# Patient Record
Sex: Female | Born: 1980 | Race: White | Hispanic: No | Marital: Single | Smoking: Current every day smoker
Health system: Southern US, Community
[De-identification: ages and names within clinical notes are randomized; demographics above are authoritative.]

## PROBLEM LIST (undated history)

## (undated) DIAGNOSIS — F419 Anxiety disorder, unspecified: Secondary | ICD-10-CM

## (undated) DIAGNOSIS — E063 Autoimmune thyroiditis: Secondary | ICD-10-CM

## (undated) HISTORY — PX: TUBAL LIGATION: SHX77

## (undated) HISTORY — DX: Autoimmune thyroiditis: E06.3

## (undated) HISTORY — DX: Anxiety disorder, unspecified: F41.9

---

## 2003-07-29 ENCOUNTER — Other Ambulatory Visit: Payer: Self-pay

## 2006-07-12 ENCOUNTER — Emergency Department: Payer: Self-pay | Admitting: Unknown Physician Specialty

## 2010-04-21 ENCOUNTER — Emergency Department: Payer: Self-pay | Admitting: Emergency Medicine

## 2011-12-28 ENCOUNTER — Emergency Department: Payer: Self-pay | Admitting: Emergency Medicine

## 2012-02-22 ENCOUNTER — Emergency Department: Payer: Self-pay | Admitting: Emergency Medicine

## 2012-06-02 ENCOUNTER — Emergency Department: Payer: Self-pay | Admitting: Emergency Medicine

## 2013-01-18 HISTORY — PX: TUBAL LIGATION: SHX77

## 2013-05-05 ENCOUNTER — Emergency Department: Payer: Self-pay | Admitting: Emergency Medicine

## 2013-05-06 ENCOUNTER — Emergency Department: Payer: Self-pay | Admitting: Emergency Medicine

## 2013-05-15 ENCOUNTER — Ambulatory Visit: Payer: Self-pay | Admitting: Advanced Practice Midwife

## 2013-06-01 ENCOUNTER — Emergency Department: Payer: Self-pay | Admitting: Emergency Medicine

## 2013-10-01 ENCOUNTER — Emergency Department: Payer: Self-pay | Admitting: Emergency Medicine

## 2013-12-11 ENCOUNTER — Observation Stay: Payer: Self-pay | Admitting: Obstetrics & Gynecology

## 2014-01-03 ENCOUNTER — Observation Stay: Payer: Self-pay

## 2014-01-06 ENCOUNTER — Observation Stay: Payer: Self-pay

## 2014-01-06 LAB — PROTEIN / CREATININE RATIO, URINE
Creatinine, Urine: 59.8 mg/dL (ref 30.0–125.0)
PROTEIN/CREAT. RATIO: 334 mg/g{creat} — AB (ref 0–200)
Protein, Random Urine: 20 mg/dL — ABNORMAL HIGH (ref 0–12)

## 2014-01-07 ENCOUNTER — Inpatient Hospital Stay: Payer: Self-pay | Admitting: Obstetrics & Gynecology

## 2014-01-07 LAB — DRUG SCREEN, URINE
Amphetamines, Ur Screen: NEGATIVE (ref ?–1000)
Barbiturates, Ur Screen: NEGATIVE (ref ?–200)
Benzodiazepine, Ur Scrn: NEGATIVE (ref ?–200)
CANNABINOID 50 NG, UR ~~LOC~~: NEGATIVE (ref ?–50)
COCAINE METABOLITE, UR ~~LOC~~: NEGATIVE (ref ?–300)
MDMA (Ecstasy)Ur Screen: NEGATIVE (ref ?–500)
Methadone, Ur Screen: NEGATIVE (ref ?–300)
Opiate, Ur Screen: NEGATIVE (ref ?–300)
Phencyclidine (PCP) Ur S: NEGATIVE (ref ?–25)
TRICYCLIC, UR SCREEN: NEGATIVE (ref ?–1000)

## 2014-01-07 LAB — CBC WITH DIFFERENTIAL/PLATELET
BASOS PCT: 0.9 %
Basophil #: 0.1 10*3/uL (ref 0.0–0.1)
Eosinophil #: 0.2 10*3/uL (ref 0.0–0.7)
Eosinophil %: 1 %
HCT: 42.6 % (ref 35.0–47.0)
HGB: 14.2 g/dL (ref 12.0–16.0)
LYMPHS ABS: 2.4 10*3/uL (ref 1.0–3.6)
LYMPHS PCT: 14.8 %
MCH: 34.3 pg — AB (ref 26.0–34.0)
MCHC: 33.4 g/dL (ref 32.0–36.0)
MCV: 103 fL — AB (ref 80–100)
MONO ABS: 1 x10 3/mm — AB (ref 0.2–0.9)
MONOS PCT: 6.4 %
Neutrophil #: 12.2 10*3/uL — ABNORMAL HIGH (ref 1.4–6.5)
Neutrophil %: 76.9 %
Platelet: 164 10*3/uL (ref 150–440)
RBC: 4.14 10*6/uL (ref 3.80–5.20)
RDW: 14 % (ref 11.5–14.5)
WBC: 15.9 10*3/uL — ABNORMAL HIGH (ref 3.6–11.0)

## 2014-01-07 LAB — BASIC METABOLIC PANEL
Anion Gap: 10 (ref 7–16)
BUN: 11 mg/dL (ref 7–18)
CHLORIDE: 106 mmol/L (ref 98–107)
CREATININE: 0.64 mg/dL (ref 0.60–1.30)
Calcium, Total: 8.8 mg/dL (ref 8.5–10.1)
Co2: 21 mmol/L (ref 21–32)
EGFR (African American): >60
EGFR (Non-African Amer.): >60
GLUCOSE: 71 mg/dL (ref 65–99)
Osmolality: 272 (ref 275–301)
Potassium: 4 mmol/L (ref 3.5–5.1)
SODIUM: 137 mmol/L (ref 136–145)

## 2014-01-07 LAB — URIC ACID: URIC ACID: 4.7 mg/dL (ref 2.6–6.0)

## 2014-01-07 LAB — SGOT (AST)(ARMC): AST: 25 U/L (ref 15–37)

## 2014-01-08 LAB — HEMATOCRIT: HCT: 39 % (ref 35.0–47.0)

## 2014-05-11 NOTE — Op Note (Signed)
PATIENT NAME:  Debra GosselinOVERMAN, Bennie D MR#:  [id number] DATE OF BIRTH:  [birth year]  DATE OF PROCEDURE:  01/08/2014  PREOPERATIVE DIAGNOSIS: Desire for permanent sterility.   POSTOPERATIVE DIAGNOSIS: Desire for permanent sterility.   PROCEDURE PERFORMED: Postpartum tubal ligation.   SURGEON: Dierdre Searles. Paul Milanie Rosenfield, MD   ANESTHESIA: General.   ESTIMATED BLOOD LOSS: Minimal.   COMPLICATIONS: None.   FINDINGS: Normal tubes.   DISPOSITION: To recovery room stable.   TECHNIQUE: The patient is prepped and draped in the usual sterile fashion. After adequate anesthesia is obtained in the supine position on the Operating Room table, Marcaine 0.5% is used to anesthetize the skin just inferior to the umbilicus, followed by a skin incision using a scalpel. Dissection is carried down to the level of the rectus fascia, which is then tented anteriorly and incised with Metzenbaum scissors. Examination reveals no anterior abdominal wall adhesions. The patient is placed in Trendelenburg positioning and retractors are placed.   The right fallopian tube is identified and followed out to its fimbriae. Then the midportion is tied with 2 Vicryl sutures, excised, and cauterized. The left fallopian tube is identified out to its fimbriae, and then the midportion is grasped with a Babcock clamp and a loop is tied with 2 Vicryl sutures, excised, and cauterized. Excellent hemostasis is noted at the tubal sites.   The patient is leveled, and the peritoneum and fascia are closed with 0 Vicryl suture. Subcutaneous level is irrigated and skin is closed with Dermabond skin glue.   The patient goes to the recovery room in stable condition. All sponge, instrument, and needle counts were correct.    ____________________________ R. Annamarie MajorPaul Aletta Edmunds, MD rph:MT D: 01/08/2014 10:53:00 ET T: 01/08/2014 12:00:40 ET JOB#: [id number]  cc: Dierdre Searles. Paul Jarica Plass, MD, <Dictator> Nadara MustardOBERT P Jadelyn Elks MD ELECTRONICALLY SIGNED 01/08/2014 15:45

## 2014-05-13 LAB — SURGICAL PATHOLOGY

## 2014-05-28 NOTE — H&P (Signed)
L&D Evaluation:  History:  HPI 34 year old G3 P2002 with EDC=01/03/2014 by LMP=03/29/2013 and c/w a 6wk4day ultrasound presents at 40.4 weeks with c/o contractions. NO VB or LOF. Baby active. She was seen on L&D earlier in the evening and sent home due to no cervical change. Contractions irregular. PNC at [facility] remarkable for tobacco use, hx of using crack cocaine (last use 12/2012), and incarceration 12/2012-02/2013. Currently lives in a boarding house. 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 She is O POS/RI/VI/GBS negative. She desires a BTL pp.   Presents with contractions   Patient's Medical History Hx of substance abuse, trich, HSV   Patient's Surgical History none   Medications Pre Natal Vitamins   Allergies NKDA   Social History tobacco  drugs  1/2 PPD, crack/cocaine, MJ   Family History Non-Contributory   ROS:  ROS see HPI   Exam:  Vital Signs stable   Urine Protein not completed   General no apparent distress   Mental Status clear   Abdomen gravid, tender with contractions   Fetal Position vtx   Pelvic no external lesions, 3/100/-1   Mebranes Intact   FHT normal rate with no decels, 130s, moderate variability, +accels, no decls   FHT Description Cat 1   Ucx difficult to assess on the monitor   Skin dry, no lesions   Lymph no lymphadenopathy   Impression:  Impression early labor, reactive NST, IUP at 40.4 weeks. Reactive NST. labor   Plan:  Plan EFM/NST, admit for labor due to cervical change, IV analgesia, epidural when ready, UDS.   Follow Up Appointment need to schedule   Electronic Signatures: Jannet MantisSubudhi, Jaki Hammerschmidt (CNM)  (Signed 21-Dec-15 07:44)  Authored: L&D Evaluation   Last Updated: 21-Dec-15 07:44 by Jannet MantisSubudhi, Hillarie Harrigan (CNM)

## 2014-05-28 NOTE — H&P (Signed)
L&D Evaluation:  History:  HPI 34 year old G3 P2002 with EDC=01/03/2014 by LMP=03/29/2013 and c/w a 6wk4day ultrasound presents at 40 weeks with c/o sacral back pain. NO VB or LOF. Baby active. Back pain comes and goes. Contractions irregular. PNC at [facility] remarkable for tobacco use, hx of using crack cocaine (last use 12/2012, and incarceration 12/2012-02/2013. Currently lives in a boarding house. O POS/RI/VI.   Presents with back pain   Patient's Medical History Hx of substance abuse   Patient's Surgical History none   Medications Pre Natal Vitamins   Allergies NKDA   Social History tobacco  drugs  1/2 PPD   Family History Non-Contributory   ROS:  ROS see HPI   Exam:  Vital Signs stable  117/70   Urine Protein not completed   General no apparent distress   Mental Status clear   Abdomen gravid, non-tender   Fetal Position vtx   Pelvic no external lesions, FT/25-50%/-1 to -2   Mebranes Intact   FHT normal rate with no decels, 135 with accels to 170s   FHT Description Cat 1   Ucx irregular   Impression:  Impression IUP at 40 weeks. Reactive NST. Back pain with lower station . Not in labor   Plan:  Plan DC home with labor precautions.   Electronic Signatures: Trinna BalloonGutierrez, Sakura Denis L (CNM)  (Signed 18-Dec-15 08:50)  Authored: L&D Evaluation   Last Updated: 18-Dec-15 08:50 by Trinna BalloonGutierrez, Dellamae Rosamilia L (CNM)

## 2014-06-20 ENCOUNTER — Emergency Department
Admission: EM | Admit: 2014-06-20 | Discharge: 2014-06-20 | Disposition: A | Discharge status: Home / Self Care | Payer: Self-pay | Attending: Emergency Medicine | Admitting: Emergency Medicine

## 2014-06-20 ENCOUNTER — Encounter: Payer: Self-pay | Admitting: Emergency Medicine

## 2014-06-20 DIAGNOSIS — Z720 Tobacco use: Secondary | ICD-10-CM | POA: Insufficient documentation

## 2014-06-20 DIAGNOSIS — L259 Unspecified contact dermatitis, unspecified cause: Secondary | ICD-10-CM | POA: Insufficient documentation

## 2014-06-20 MED ORDER — HYDROXYZINE HCL 50 MG PO TABS
50.0000 mg | ORAL_TABLET | Freq: Once | ORAL | Status: AC
  Administered 2014-06-20: 50 mg via ORAL

## 2014-06-20 MED ORDER — HYDROXYZINE HCL 50 MG PO TABS
ORAL_TABLET | ORAL | Status: AC
  Administered 2014-06-20: 50 mg via ORAL
  Filled 2014-06-20: qty 1

## 2014-06-20 MED ORDER — HYDROXYZINE HCL 10 MG/5ML PO SYRP: 10.0000 mg | ORAL_SOLUTION | Freq: Once | ORAL | Status: DC

## 2014-06-20 MED ORDER — DEXAMETHASONE SODIUM PHOSPHATE 10 MG/ML IJ SOLN
10.0000 mg | Freq: Once | INTRAMUSCULAR | Status: AC
  Administered 2014-06-20: 10 mg via INTRAMUSCULAR

## 2014-06-20 MED ORDER — METHYLPREDNISOLONE 4 MG PO TBPK: ORAL_TABLET | ORAL | Status: DC

## 2014-06-20 MED ORDER — DEXAMETHASONE SODIUM PHOSPHATE 10 MG/ML IJ SOLN
INTRAMUSCULAR | Status: AC
  Administered 2014-06-20: 10 mg via INTRAMUSCULAR
  Filled 2014-06-20: qty 1

## 2014-06-20 MED ORDER — HYDROXYZINE HCL 50 MG PO TABS: 50.0000 mg | ORAL_TABLET | Freq: Three times a day (TID) | ORAL | Status: DC | PRN

## 2014-06-20 NOTE — ED Provider Notes (Signed)
[facility] Emergency Department Provider Note  ____________________________________________  Time seen: Approximately 6:53 PM  I have reviewed the triage vital signs and the nursing notes.   HISTORY  Chief Complaint Rash    HPI Debra Blake is a 34 y.o. female complaining of a rash on bilateral arms intermittently for 1 month. Patient state they itch and she is concerned possibly of a rash to her infant. 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 He stated this been no signs of secondary infection. States she gets some mild relief taking over-the-counter Benadryl. Denies any pain.  History reviewed. No pertinent past medical history.  There are no active problems to display for this patient.   Past Surgical History  Procedure Laterality Date  . Tubal ligation      No current outpatient prescriptions on file.  Allergies Review of patient's allergies indicates no known allergies.  No family history on file.  Social History History  Substance Use Topics  . Smoking status: Current Every Day Smoker -- 1.00 packs/day    Types: Cigarettes  . Smokeless tobacco: Not on file  . Alcohol Use: No    Review of Systems Constitutional: No fever/chills Eyes: No visual changes. ENT: No sore throat. Cardiovascular: Denies chest pain. Respiratory: Denies shortness of breath. Gastrointestinal: No abdominal pain.  No nausea, no vomiting.  No diarrhea.  No constipation. Genitourinary: Negative for dysuria. Musculoskeletal: Negative for back pain. Skin: Rash bilateral forearms. Neurological: Negative for headaches, focal weakness or numbness. 10-point ROS otherwise negative.  ____________________________________________   PHYSICAL EXAM:  VITAL SIGNS: ED Triage Vitals  Enc Vitals Group     BP 06/20/14 1821 120/81 mmHg     Pulse Rate 06/20/14 1821 91     Resp 06/20/14 1821 18     Temp 06/20/14 1821 98.1 F (36.7 C)     Temp Source  06/20/14 1821 Oral     SpO2 06/20/14 1821 99 %     Weight 06/20/14 1821 178 lb (80.74 kg)     Height 06/20/14 1821  (1.651 m)     Head Cir --      Peak Flow --      Pain Score --      Pain Loc --      Pain Edu? --      Excl. in GC? --     Constitutional: Alert and oriented. Well appearing and in no acute distress. Eyes: Conjunctivae are normal. PERRL. EOMI. Head: Atraumatic. Nose: No congestion/rhinnorhea. Mouth/Throat: Mucous membranes are moist.  Oropharynx non-erythematous. Neck: No stridor. No deformity neurovascularly intact pre-and equal range of motion. Hematological/Lymphatic/Immunilogical: No cervical lymphadenopathy. Cardiovascular: Normal rate, regular rhythm. Grossly normal heart sounds.  Good peripheral circulation. Respiratory: Normal respiratory effort.  No retractions. Lungs CTAB. Gastrointestinal: Soft and nontender. No distention. No abdominal bruits. No CVA tenderness. Musculoskeletal: No lower extremity tenderness nor edema.  No joint effusions. Neurologic:  Normal speech and language. No gross focal neurologic deficits are appreciated. Speech is normal. No gait instability. Skin:  Skin is warm, dry and intact. No rash noted. Psychiatric: Mood and affect are normal. Speech and behavior are normal.  ____________________________________________   LABS (all labs ordered are listed, but only abnormal results are displayed)  Labs Reviewed - No data to display ____________________________________________  EKG ____________________________________________  RADIOLOGY   ____________________________________________   PROCEDURES  Procedure(s) performed: None  Critical Care performed: No  ____________________________________________   INITIAL IMPRESSION / ASSESSMENT AND PLAN / ED COURSE  Pertinent labs & imaging results that were available during my care of the patient were reviewed by me and considered in my medical decision making (see chart for  details).  Contact dermatitis. ____________________________________________   FINAL CLINICAL IMPRESSION(S) / ED DIAGNOSES  Final diagnoses:  Contact dermatitis      Joni ReiningRonald K Dominigue Gellner, PA-C 06/20/14 1857  Richardean Canalavid H Yao, MD 06/20/14 2223

## 2014-06-20 NOTE — ED Notes (Signed)
Pt has red bumps on arms and back, states they itch.

## 2014-06-20 NOTE — Discharge Instructions (Signed)
Take medications as directed and follow up with Dermatologist if no improvement or worsen of compliant.

## 2014-08-13 ENCOUNTER — Encounter: Payer: Self-pay | Admitting: Emergency Medicine

## 2014-08-13 ENCOUNTER — Emergency Department
Admission: EM | Admit: 2014-08-13 | Discharge: 2014-08-13 | Disposition: A | Discharge status: Home / Self Care | Payer: Self-pay | Attending: Emergency Medicine | Admitting: Emergency Medicine

## 2014-08-13 DIAGNOSIS — T85848A Pain due to other internal prosthetic devices, implants and grafts, initial encounter: Secondary | ICD-10-CM

## 2014-08-13 DIAGNOSIS — K029 Dental caries, unspecified: Secondary | ICD-10-CM | POA: Insufficient documentation

## 2014-08-13 DIAGNOSIS — K0381 Cracked tooth: Secondary | ICD-10-CM | POA: Insufficient documentation

## 2014-08-13 DIAGNOSIS — Z7952 Long term (current) use of systemic steroids: Secondary | ICD-10-CM | POA: Insufficient documentation

## 2014-08-13 DIAGNOSIS — Y832 Surgical operation with anastomosis, bypass or graft as the cause of abnormal reaction of the patient, or of later complication, without mention of misadventure at the time of the procedure: Secondary | ICD-10-CM | POA: Insufficient documentation

## 2014-08-13 DIAGNOSIS — T827XXA Infection and inflammatory reaction due to other cardiac and vascular devices, implants and grafts, initial encounter: Secondary | ICD-10-CM | POA: Insufficient documentation

## 2014-08-13 DIAGNOSIS — Z720 Tobacco use: Secondary | ICD-10-CM | POA: Insufficient documentation

## 2014-08-13 MED ORDER — PENICILLIN V POTASSIUM 250 MG PO TABS: 500.0000 mg | ORAL_TABLET | Freq: Three times a day (TID) | ORAL | Status: DC

## 2014-08-13 MED ORDER — TRAMADOL HCL 50 MG PO TABS: 50.0000 mg | ORAL_TABLET | Freq: Four times a day (QID) | ORAL | Status: DC | PRN

## 2014-08-13 NOTE — ED Notes (Signed)
Patient to ED with c/o "several abscess" patient reports pain to both upper and lower jaw of right side.

## 2014-08-13 NOTE — ED Provider Notes (Signed)
[facility] Emergency Department Provider Note ____________________________________________  Time seen: Approximately 7:55 PM  I have reviewed the triage vital signs and the nursing notes.   HISTORY  Chief Complaint Dental Pain    HPI Debra Blake is a 34 y.o. female who presents to the emergency department for evaluation of dental pain. She states that she has had chronic dental pain for several months however over the past week the pain has increased on the right upper and lower jaw. She states that she has taken "over 100 ibuprofen" over the past week. She is unable to see a dentist due to the lack of dental insurance.   History reviewed. No pertinent past medical history.  There are no active problems to display for this patient.   Past Surgical History  Procedure Laterality Date  . Tubal ligation      Current Outpatient Rx  Name  Route  Sig  Dispense  Refill  . hydrOXYzine (ATARAX/VISTARIL) 50 MG tablet   Oral   Take 1 tablet (50 mg total) by mouth 3 (three) times daily as needed.   30 tablet   0   . methylPREDNISolone (MEDROL DOSEPAK) 4 MG TBPK tablet      Take Tapered dose as directed   21 tablet   0   . penicillin v potassium (VEETID) 250 MG tablet   Oral   Take 2 tablets (500 mg total) by mouth 3 (three) times daily.   60 tablet   0   . traMADol (ULTRAM) 50 MG tablet   Oral   Take 1 tablet (50 mg total) by mouth every 6 (six) hours as needed.   9 tablet   0     Allergies Review of patient's allergies indicates no known allergies.  History reviewed. No pertinent family history.  Social History History  Substance Use Topics  . Smoking status: Current Every Day Smoker -- 1.00 packs/day    Types: Cigarettes  . Smokeless tobacco: Not on file  . Alcohol Use: No    Review of Systems Constitutional: No fever/chills Eyes: No visual changes. ENT: No sore throat. Cardiovascular: Denies chest pain. Respiratory:  Denies shortness of breath. Gastrointestinal: No abdominal pain.  No nausea, no vomiting.  Genitourinary: Negative for dysuria. Musculoskeletal: Negative for back pain. Skin: Negative for rash. Neurological: Negative for headaches, focal weakness or numbness. 10-point ROS otherwise negative.  ____________________________________________   PHYSICAL EXAM:  VITAL SIGNS: ED Triage Vitals  Enc Vitals Group     BP 08/13/14 1742 111/80 mmHg     Pulse Rate 08/13/14 1742 88     Resp 08/13/14 1742 20     Temp 08/13/14 1742 98.3 F (36.8 C)     Temp Source 08/13/14 1742 Oral     SpO2 08/13/14 1742 97 %     Weight 08/13/14 1742 184 lb (83.462 kg)     Height 08/13/14 1742  (1.651 m)     Head Cir --      Peak Flow --      Pain Score 08/13/14 1743 10     Pain Loc --      Pain Edu? --      Excl. in GC? --     Constitutional: Alert and oriented. Well appearing and in no acute distress. Eyes: Conjunctivae are normal. PERRL. EOMI. Head: Atraumatic. Nose: No congestion/rhinnorhea. Mouth/Throat: Mucous membranes are moist.  Oropharynx non-erythematous. Periodontal Exam    widespread dental decay Neck: No stridor.  Hematological/Lymphatic/Immunilogical: No  cervical lymphadenopathy. Cardiovascular:   Good peripheral circulation. Respiratory: Normal respiratory effort.  No retractions. Musculoskeletal: No lower extremity tenderness nor edema.  No joint effusions. Neurologic:  Normal speech and language. No gross focal neurologic deficits are appreciated. Speech is normal. No gait instability. Skin:  Skin is warm, dry and intact. No rash noted. Psychiatric: Mood and affect are normal. Speech and behavior are normal.  ____________________________________________   LABS (all labs ordered are listed, but only abnormal results are displayed)  Labs Reviewed - No data to display ____________________________________________   RADIOLOGY  Not  indicated ____________________________________________   PROCEDURES  Procedure(s) performed: None  Critical Care performed: No  ____________________________________________   INITIAL IMPRESSION / ASSESSMENT AND PLAN / ED COURSE  Pertinent labs & imaging results that were available during my care of the patient were reviewed by me and considered in my medical decision making (see chart for details).  Patient was advised to see the dentist within 14 days. Also advised to take the antibiotic until finished. Instructed to return to the ER for symptoms that change or worsen if you are unable to schedule an appointment. ____________________________________________   FINAL CLINICAL IMPRESSION(S) / ED DIAGNOSES  Final diagnoses:  Dental implant pain, initial encounter       Chinita Pester, FNP 08/13/14 Paulo Fruit  Myrna Blazer, MD 08/17/14 2216

## 2014-08-13 NOTE — Discharge Instructions (Signed)
OPTIONS FOR DENTAL FOLLOW UP CARE ° °[place] Department of Health and Human Services - Local Safety Net Dental Clinics °http://www.ncdhhs.gov/dph/oralhealth/services/safetynetclinics.htm °  °[facility] (336-562-3123) ° °[facility] [place] (919-933-9087) ° °[facility] ([phone] ext [phone]) ° °[facility] ([phone]) ° °[facility] ([phone]) °This clinic caters to the indigent population and is on a lottery system. °Location: °[facility], [address], [place] °Clinic Hours: °Wednesdays from 6pm - 9pm, patients seen by a lottery system. °For dates, call or go to www.med.[facility].edu/[facility]/patients/Dental-[facility] °Services: °Cleanings, fillings and simple extractions. °Payment Options: °DENTAL WORK IS FREE OF CHARGE. Bring proof of income or support. °Best way to get seen: °Arrive at 5:15 pm - this is a lottery, NOT first come/first serve, so arriving earlier will not increase your chances of being seen. °  °  °[facility] °919-537-3737 °Select option 1 for emergencies °  °Location: °[facility], [address], [place] °Clinic Hours: °No walk-ins accepted - call the day before to schedule an appointment. °Check in times are 9:30 am and 1:30 pm. °Services: °Simple extractions, temporary fillings, pulpectomy/pulp debridement, uncomplicated abscess drainage. °Payment Options: °PAYMENT IS DUE AT THE TIME OF SERVICE.  Fee is usually $100-200, additional surgical procedures (e.g. abscess drainage) may be extra. °Cash, checks, Visa/MasterCard accepted.  Can file Medicaid if patient is covered for dental - patient should call case worker to check. °No discount for [facility] patients. °Best way to get seen: °MUST call the day before and get onto the schedule. Can usually be seen the next 1-2 days. No walk-ins accepted. °  °  °[facility] °[phone] °   °Location: °[facility], [address], [place] °Clinic Hours: °M, W, Th, F 8am or 1:30pm, Tues 9a or 1:30 - first come/first served. °Services: °Simple extractions, temporary fillings, uncomplicated abscess drainage.  You do not need to be an [place] resident. °Payment Options: °PAYMENT IS DUE AT THE TIME OF SERVICE. °Dental insurance, otherwise sliding scale - bring proof of income or support. °Depending on income and treatment needed, cost is usually $50-200. °Best way to get seen: °Arrive early as it is first come/first served. °  °  °[facility] °[phone] °  °Location: °[address] °Clinic Hours: °Mon-Thu 8a-5p °Services: °Most basic dental services including extractions and fillings. °Payment Options: °PAYMENT IS DUE AT THE TIME OF SERVICE. °Sliding scale, up to 50% off - bring proof if income or support. °Medicaid with dental option accepted. °Best way to get seen: °Call to schedule an appointment, can usually be seen within 2 weeks OR they will try to see walk-ins - show up at 8a or 2p (you may have to wait). °  °  °[facility] °919-245-2435 °[place] RESIDENTS ONLY °  °Location: °[facility], [address], [place], [place] [address] °Clinic Hours: By appointment only. °Monday - Thursday 8am-5pm, Friday 8am-12pm °Services: Cleanings, fillings, extractions. °Payment Options: °PAYMENT IS DUE AT THE TIME OF SERVICE. °Cash, Visa or MasterCard. Sliding scale - $30 minimum per service. °Best way to get seen: °Come in to office, complete packet and make an appointment - need proof of income °or support monies for each household member and proof of [place] residence. °Usually takes about a month to get in. °  °  °[facility] °[phone] °  °Location: °[address]   [place] °Clinic Hours: Walk-in Urgent Care Dental Services are offered Monday-Friday  mornings only. °The numbers of emergencies accepted daily is limited to the number of °providers available. °Maximum 15 - Mondays, Wednesdays & Thursdays °Maximum 10 - Tuesdays & Fridays °Services: °You do not need to be a  [place] resident to be seen for a dental emergency. °Emergencies are defined as pain, swelling, abnormal bleeding, or dental trauma. Walkins will receive x-rays if needed. °NOTE: Dental cleaning is not an emergency. °Payment Options: °PAYMENT IS DUE AT THE TIME OF SERVICE. °Minimum co-pay is $40.00 for uninsured patients. °Minimum co-pay is $3.00 for Medicaid with dental coverage. °Dental Insurance is accepted and must be presented at time of visit. °Medicare does not cover dental. °Forms of payment: Cash, credit card, checks. °Best way to get seen: °If not previously registered with the clinic, walk-in dental registration begins at 7:15 am and is on a first come/first serve basis. °If previously registered with the clinic, call to make an appointment. °  °  °[facility] °[phone] °[place] RESIDENTS ONLY °  °Location: °[address], [place], [place] °Clinic Hours: °Mon-Thu 10a-2p °Services: Extractions only! °Payment Options: °FREE (donations accepted) - bring proof of income or support °Best way to get seen: °Call and schedule an appointment OR come at 8am on the 1st Monday of every month (except for holidays) when it is first come/first served. °  °  °[facility] °[phone] °  °Location: °[address], [place] °Clinic Hours: °Friday mornings °Services, Payment Options, Best way to get seen: °Call for info °

## 2014-10-19 ENCOUNTER — Emergency Department
Admission: EM | Admit: 2014-10-19 | Discharge: 2014-10-20 | Disposition: A | Discharge status: Home / Self Care | Payer: Self-pay | Attending: Emergency Medicine | Admitting: Emergency Medicine

## 2014-10-19 DIAGNOSIS — Z792 Long term (current) use of antibiotics: Secondary | ICD-10-CM | POA: Insufficient documentation

## 2014-10-19 DIAGNOSIS — R531 Weakness: Secondary | ICD-10-CM | POA: Insufficient documentation

## 2014-10-19 DIAGNOSIS — Z720 Tobacco use: Secondary | ICD-10-CM | POA: Insufficient documentation

## 2014-10-19 DIAGNOSIS — R238 Other skin changes: Secondary | ICD-10-CM

## 2014-10-19 DIAGNOSIS — L988 Other specified disorders of the skin and subcutaneous tissue: Secondary | ICD-10-CM | POA: Insufficient documentation

## 2014-10-19 DIAGNOSIS — Z79899 Other long term (current) drug therapy: Secondary | ICD-10-CM | POA: Insufficient documentation

## 2014-10-19 DIAGNOSIS — R5382 Chronic fatigue, unspecified: Secondary | ICD-10-CM | POA: Insufficient documentation

## 2014-10-19 LAB — CBC
HEMATOCRIT: 40.3 % (ref 35.0–47.0)
HEMOGLOBIN: 13.7 g/dL (ref 12.0–16.0)
MCH: 32.6 pg (ref 26.0–34.0)
MCHC: 34.1 g/dL (ref 32.0–36.0)
MCV: 95.6 fL (ref 80.0–100.0)
Platelets: 183 10*3/uL (ref 150–440)
RBC: 4.22 MIL/uL (ref 3.80–5.20)
RDW: 13.6 % (ref 11.5–14.5)
WBC: 10.8 10*3/uL (ref 3.6–11.0)

## 2014-10-19 LAB — BASIC METABOLIC PANEL
ANION GAP: 6 (ref 5–15)
BUN: 15 mg/dL (ref 6–20)
CHLORIDE: 107 mmol/L (ref 101–111)
CO2: 25 mmol/L (ref 22–32)
Calcium: 8.8 mg/dL — ABNORMAL LOW (ref 8.9–10.3)
Creatinine, Ser: 0.89 mg/dL (ref 0.44–1.00)
GFR calc Af Amer: >60 mL/min (ref >60)
GFR calc non Af Amer: >60 mL/min (ref >60)
Glucose, Bld: 100 mg/dL — ABNORMAL HIGH (ref 65–99)
POTASSIUM: 4 mmol/L (ref 3.5–5.1)
SODIUM: 138 mmol/L (ref 135–145)

## 2014-10-19 LAB — URINALYSIS COMPLETE WITH MICROSCOPIC (ARMC ONLY)
BACTERIA UA: NONE SEEN
Bilirubin Urine: NEGATIVE
Glucose, UA: NEGATIVE mg/dL
Ketones, ur: NEGATIVE mg/dL
Nitrite: NEGATIVE
PROTEIN: NEGATIVE mg/dL
Specific Gravity, Urine: 1.017 (ref 1.005–1.030)
pH: 5 (ref 5.0–8.0)

## 2014-10-19 NOTE — Discharge Instructions (Signed)
Blisters Blisters are fluid-filled sacs that form within the skin. Common causes of blistering are friction, burns, and exposure to irritating chemicals. The fluid in the blister protects the underlying damaged skin. Most of the time it is not recommended that you open blisters. When a blister is opened, there is an increased chance for infection. Usually, a blister will open on its own. They then dry up and peel off within 10 days. If the blister is tense and uncomfortable (painful) the fluid may be drained. If it is drained the roof of the blister should be left intact. The draining should only be done by a medical professional under aseptic conditions. Poorly fitting shoes and boots can cause blisters by being too tight or too loose. Wearing extra socks or using tape, bandages, or pads over the blister-prone area helps prevent the problem by reducing friction. Blisters heal more slowly if you have diabetes or if you have problems with your circulation. You need to be careful about medical follow-up to prevent infection. HOME CARE INSTRUCTIONS  Protect areas where blisters have formed until the skin is healed. Use a special bandage with a hole cut in the middle around the blister. This reduces pressure and friction. When the blister breaks, trim off the loose skin and keep the area clean by washing it with soap daily. Soaking the blister or broken-open blister with diluted vinegar twice daily for 15 minutes will dry it up and speed the healing. Use 3 tablespoons of white vinegar per quart of water (45 mL white vinegar per liter of water). An antibiotic ointment and a bandage can be used to cover the area after soaking.  SEEK MEDICAL CARE IF:   You develop increased redness, pain, swelling, or drainage in the blistered area.  You develop a pus-like discharge from the blistered area, chills, or a fever. MAKE SURE YOU:   Understand these instructions.  Will watch your condition.  Will get help right  away if you are not doing well or get worse. Document Released: 02/12/2004 Document Revised: 03/29/2011 Document Reviewed: 01/10/2008 [facility] Patient Information 2015 [place], [place]. This information is not intended to replace advice given to you by your health care provider. Make sure you discuss any questions you have with your health care provider.  Chronic Fatigue Syndrome Chronic fatigue syndrome (CFS) is a condition in which there is lasting, extreme tiredness (fatigue) that does not improve with rest. CFS affects women up to four times more often than men. If you have CFS, fatigue and other symptoms can make it hard for you to get through your day. There is no treatment or cure. You will need to work closely with your health care provider to come up with a treatment plan that works for you. CAUSES  No one knows what causes CFS. It may be triggered by a flu-like illness or by mono. Other triggers may include:  An abnormal immune system.  Low blood pressure.  Poor diet.  Physical or emotional stress. SIGNS AND SYMPTOMS The main symptom is fatigue that lasts all day, especially after physical or mental stress. Other common symptoms include:  An extreme loss of energy with no obvious cause.  Muscle or joint soreness.  Severe weakness.  Frequent headaches.  Fever.  Sore throat.  Swollen lymph glands.  Sleep is not refreshing.  Loss of concentration or memory. Less common symptoms may include:  Chills.  Night sweats.  Tingling or numbness.  Blurred vision.  Dizziness.  Sensitivity to noise or  odors.  Mood swings.  Anxiety, panic attacks, and depression. Your symptoms may come and go, or you may have them all the time. DIAGNOSIS  There are no tests that can help health care providers diagnose CFS. It may take a long time for you to get a correct diagnosis. Your health care provider may need to do a number of tests to rule out other conditions that could be  causing your symptoms. You may be diagnosed with CFS if:  You have fatigue that has lasted for at least six months.  Your fatigue is not relieved by rest.  Your fatigue is not caused by another condition.  Your fatigue is severe enough to interfere with work and daily activities.  You have at least four common symptoms of CFS. TREATMENT  There is no cure for CFS at this time. The condition affects everyone differently. You will need to work with your health care provider to find the best treatment for your symptoms. Treatment may include:  Improving sleep with a regular bedtime routine.  Avoiding caffeine, alcohol, and tobacco.  Doing light exercise and stretching during the day.  Taking medicine to help you sleep.  Taking over-the-counter medicines to relieve joint or muscle pain.  Learning and practicing relaxation techniques.  Using memory aids or doing brain teasers to improve memory and concentration.  Seeing a mental health professional to evaluate and treat depression, if necessary.  Trying massage therapy, acupuncture, and movement exercises, like yoga or tai chi. [place] Work closely with your health care provider to follow your treatment plan at home. You may need to make major lifestyle changes. If treatment does not seem to help, get a second opinion. You may get help from many health care providers, including doctors, mental health specialists, physical therapists, and rehabilitation therapists. Having the support of friends and loved ones is also important. SEEK MEDICAL CARE IF:  Your symptoms are not responding to treatment.  You are having strong feelings of anger, guilt, anxiety, or depression. Document Released: 02/12/2004 Document Revised: 05/21/2013 Document Reviewed: 11/24/2012 [facility] Patient Information 2015 [place], [place]. This information is not intended to replace advice given to you by your health care provider. Make sure you  discuss any questions you have with your health care provider.

## 2014-10-19 NOTE — ED Notes (Signed)
"  I've been noticing that I've been getting these blisters for several weeks. And I feel real weak."

## 2014-10-19 NOTE — ED Provider Notes (Signed)
[facility] Emergency Department Provider Note  ____________________________________________  Time seen: Approximately 11:41 PM  I have reviewed the triage vital signs and the nursing notes.   HISTORY  Chief Complaint Blister and Weakness    HPI Debra Blake is a 34 y.o. female who comes into the hospital today with a blister on her left ankle. The patient reports that she's had similar ones come up on her shin and on her arms in the past. She reports that she breaks out occasionally. She noticed these blisters last night and became concerned so she decided to come in for evaluation. The patient reports that they typically start off as a spot on her skin and then turned into blisters. She reports that the spots have been coming up for the past year but have been blistering in the past month. She reports that usually date itchy at first and then they become sore after she's itched them. The patient denies spending any time outdoors and denies any contact with possible poison ivy or poison oak. The patient reports that in the past she has put hydrocortisone on the blisters but did not put anything on these blisters on her ankle. The patient does not have a primary care physician so she has not been to the doctor. She also reports that she feels tired all the time and weak. She reports that she has been feeling this way for the past few months. The patient reports that she's been eating well and sleeping well has not had this fatigue evaluated. He denies any other pain or any abdominal pain no nausea vomiting headache or blurry vision.   No past medical history on file.  There are no active problems to display for this patient.   Past Surgical History  Procedure Laterality Date  . Tubal ligation      Current Outpatient Rx  Name  Route  Sig  Dispense  Refill  . medroxyPROGESTERone (DEPO-PROVERA) 150 MG/ML injection   Intramuscular   Inject 1 mL into the  muscle every 3 (three) months.      3   . hydrOXYzine (ATARAX/VISTARIL) 50 MG tablet   Oral   Take 1 tablet (50 mg total) by mouth 3 (three) times daily as needed.   30 tablet   0   . methylPREDNISolone (MEDROL DOSEPAK) 4 MG TBPK tablet      Take Tapered dose as directed   21 tablet   0   . penicillin v potassium (VEETID) 250 MG tablet   Oral   Take 2 tablets (500 mg total) by mouth 3 (three) times daily.   60 tablet   0   . traMADol (ULTRAM) 50 MG tablet   Oral   Take 1 tablet (50 mg total) by mouth every 6 (six) hours as needed.   9 tablet   0     Allergies Review of patient's allergies indicates no known allergies.  No family history on file.  Social History Social History  Substance Use Topics  . Smoking status: Current Every Day Smoker -- 1.00 packs/day    Types: Cigarettes  . Smokeless tobacco: Not on file  . Alcohol Use: No    Review of Systems Constitutional: Fatigue Eyes: No visual changes. ENT: No sore throat. Cardiovascular: Denies chest pain. Respiratory: Denies shortness of breath. Gastrointestinal: No abdominal pain.  No nausea, no vomiting.  No diarrhea.  No constipation. Genitourinary: Negative for dysuria. Musculoskeletal: Negative for back pain. Skin: Blisters and rash Neurological:  Negative for headaches, focal weakness or numbness.  10-point ROS otherwise negative.  ____________________________________________   PHYSICAL EXAM:  VITAL SIGNS: ED Triage Vitals  Enc Vitals Group     BP 10/19/14 1927 130/77 mmHg     Pulse Rate 10/19/14 1927 74     Resp 10/19/14 1927 20     Temp 10/19/14 1927 98.2 F (36.8 C)     Temp Source 10/19/14 1927 Oral     SpO2 10/19/14 1927 99 %     Weight 10/19/14 1927 180 lb (81.647 kg)     Height 10/19/14 1927  (1.651 m)     Head Cir --      Peak Flow --      Pain Score 10/19/14 1928 10     Pain Loc --      Pain Edu? --      Excl. in GC? --     Constitutional: Alert and oriented. Well  appearing and in no acute distress. Eyes: Conjunctivae are normal. PERRL. EOMI. Head: Atraumatic. Nose: No congestion/rhinnorhea. Mouth/Throat: Mucous membranes are moist.  Oropharynx non-erythematous. Cardiovascular: Normal rate, regular rhythm. Grossly normal heart sounds.  Good peripheral circulation. Respiratory: Normal respiratory effort.  No retractions. Lungs CTAB. Gastrointestinal: Soft and nontender. No distention. Positive bowel sounds Musculoskeletal: No lower extremity tenderness nor edema.  Neurologic:  Normal speech and language. No gross focal neurologic deficits are appreciated. Skin:  Skin is warm, dry and intact. Blisters noted to posterior left ankle one approximately 1 cm in diameter and the other is linear clear appearing fluid with no significant tenderness to palpation. Macular lesions to left leg began linear with no significant papules. Healing blisters to the patient's hand and wrist. Psychiatric: Mood and affect are normal.   ____________________________________________   LABS (all labs ordered are listed, but only abnormal results are displayed)  Labs Reviewed  BASIC METABOLIC PANEL - Abnormal; Notable for the following:    Glucose, Bld 100 (*)    Calcium 8.8 (*)    All other components within normal limits  URINALYSIS COMPLETEWITH MICROSCOPIC ([facility] ONLY) - Abnormal; Notable for the following:    Color, Urine YELLOW (*)    APPearance CLEAR (*)    Hgb urine dipstick 3+ (*)    Leukocytes, UA TRACE (*)    Squamous Epithelial / LPF 0-5 (*)    All other components within normal limits  CBC  TSH  POC URINE PREG, ED   ____________________________________________  EKG  ED ECG REPORT I, Rebecka Apley, the attending physician, personally viewed and interpreted this ECG.   Date: 10/19/2014  EKG Time: 1943  Rate: 60  Rhythm: normal EKG, normal sinus rhythm  Axis: Normal  Intervals:none  ST&T Change:  None  ____________________________________________  RADIOLOGY  None ____________________________________________   PROCEDURES  Procedure(s) performed: None  Critical Care performed: No  ____________________________________________   INITIAL IMPRESSION / ASSESSMENT AND PLAN / ED COURSE  Pertinent labs & imaging results that were available during my care of the patient were reviewed by me and considered in my medical decision making (see chart for details).  The patient is a 34 year old female who comes in today with blisters to her left ankle. The patient reports she is unsure what it is due to and does not have any known exposures. The blisters are small but tense with clear appearing fluid. I informed the patient that she probably needs to have these lesions biopsied. The patient also has a relative 3 lesions on her  left leg as well which may have the appearance of bedbug bites. The patient reports that they are itchy as well. I informed her that again she does need to have this biopsied for evaluation and to look around her house for possible infestation. I informed the patient that I would check her thyroid for the fatigue as the remainder of her blood work was unremarkable but she reports that she needed to be discharged home to pick up her children. I did give the patient a referral to the acute care clinic as well as [facility]. The patient will be discharged to follow-up. I informed the patient that she could use hydrocortisone on her blisters as well. ____________________________________________   FINAL CLINICAL IMPRESSION(S) / ED DIAGNOSES  Final diagnoses:  Blisters of multiple sites  Chronic fatigue      Rebecka Apley, MD 10/20/14 [phone]

## 2014-10-20 LAB — TSH: TSH: 21.711 u[IU]/mL — AB (ref 0.350–4.500)

## 2014-10-20 NOTE — ED Notes (Signed)

## 2015-02-25 ENCOUNTER — Encounter: Payer: Self-pay | Admitting: Emergency Medicine

## 2015-02-25 DIAGNOSIS — K047 Periapical abscess without sinus: Secondary | ICD-10-CM | POA: Insufficient documentation

## 2015-02-25 DIAGNOSIS — F17210 Nicotine dependence, cigarettes, uncomplicated: Secondary | ICD-10-CM | POA: Insufficient documentation

## 2015-02-25 DIAGNOSIS — Z792 Long term (current) use of antibiotics: Secondary | ICD-10-CM | POA: Insufficient documentation

## 2015-02-25 DIAGNOSIS — K029 Dental caries, unspecified: Secondary | ICD-10-CM | POA: Insufficient documentation

## 2015-02-25 MED ORDER — LIDOCAINE VISCOUS 2 % MT SOLN
15.0000 mL | Freq: Once | OROMUCOSAL | Status: AC
  Administered 2015-02-25: 15 mL via OROMUCOSAL
  Filled 2015-02-25: qty 15

## 2015-02-25 NOTE — ED Notes (Signed)
Pt presents to ED with pain to the bottom right side of her mouth. Pt states she has a hx of tooth abscess reports this feels the same.

## 2015-02-26 ENCOUNTER — Emergency Department
Admission: EM | Admit: 2015-02-26 | Discharge: 2015-02-26 | Disposition: A | Discharge status: Home / Self Care | Payer: Self-pay | Attending: Emergency Medicine | Admitting: Emergency Medicine

## 2015-02-26 DIAGNOSIS — K047 Periapical abscess without sinus: Secondary | ICD-10-CM

## 2015-02-26 MED ORDER — LIDOCAINE VISCOUS 2 % MT SOLN
15.0000 mL | Freq: Once | OROMUCOSAL | Status: AC
  Administered 2015-02-26: 15 mL via OROMUCOSAL
  Filled 2015-02-26: qty 15

## 2015-02-26 MED ORDER — IBUPROFEN 800 MG PO TABS
800.0000 mg | ORAL_TABLET | Freq: Once | ORAL | Status: AC
  Administered 2015-02-26: 800 mg via ORAL

## 2015-02-26 MED ORDER — AMOXICILLIN 500 MG PO CAPS: 500.0000 mg | ORAL_CAPSULE | Freq: Two times a day (BID) | ORAL | Status: DC

## 2015-02-26 MED ORDER — AMOXICILLIN 500 MG PO CAPS
500.0000 mg | ORAL_CAPSULE | Freq: Once | ORAL | Status: AC
  Administered 2015-02-26: 500 mg via ORAL
  Filled 2015-02-26: qty 1

## 2015-02-26 MED ORDER — TRAMADOL HCL 50 MG PO TABS: 50.0000 mg | ORAL_TABLET | Freq: Four times a day (QID) | ORAL | Status: DC | PRN

## 2015-02-26 MED ORDER — OXYCODONE-ACETAMINOPHEN 5-325 MG PO TABS
1.0000 | ORAL_TABLET | Freq: Once | ORAL | Status: AC
  Administered 2015-02-26: 1 via ORAL
  Filled 2015-02-26: qty 1

## 2015-02-26 MED ORDER — IBUPROFEN 800 MG PO TABS
ORAL_TABLET | ORAL | Status: AC
  Administered 2015-02-26: 800 mg via ORAL
  Filled 2015-02-26: qty 1

## 2015-02-26 NOTE — ED Notes (Signed)
Pt asking for pain medication while in the lobby. Orders received.

## 2015-02-26 NOTE — Discharge Instructions (Signed)

## 2015-02-26 NOTE — ED Provider Notes (Signed)
[facility] Emergency Department Provider Note  ____________________________________________  Time seen: 3:20 AM  I have reviewed the triage vital signs and the nursing notes.   HISTORY  Chief Complaint Dental Pain and Abscess     HPI Debra Blake is a 35 y.o. female presents with right lower jaw pain and swelling, positive toothache. Patient states that she has multiple dental caries. Current pain score 9 out of 10. Denies any fever no difficulty swallowing or breathing. Patient denies any chest discomfort    Past medical history Dental caries There are no active problems to display for this patient.   Past Surgical History  Procedure Laterality Date  . Tubal ligation      Current Outpatient Rx  Name  Route  Sig  Dispense  Refill  . amoxicillin (AMOXIL) 500 MG capsule   Oral   Take 1 capsule (500 mg total) by mouth 2 (two) times daily.   20 capsule   0   . hydrOXYzine (ATARAX/VISTARIL) 50 MG tablet   Oral   Take 1 tablet (50 mg total) by mouth 3 (three) times daily as needed.   30 tablet   0   . medroxyPROGESTERone (DEPO-PROVERA) 150 MG/ML injection   Intramuscular   Inject 1 mL into the muscle every 3 (three) months.      3   . methylPREDNISolone (MEDROL DOSEPAK) 4 MG TBPK tablet      Take Tapered dose as directed   21 tablet   0   . penicillin v potassium (VEETID) 250 MG tablet   Oral   Take 2 tablets (500 mg total) by mouth 3 (three) times daily.   60 tablet   0   . traMADol (ULTRAM) 50 MG tablet   Oral   Take 1 tablet (50 mg total) by mouth every 6 (six) hours as needed.   15 tablet   0     Allergies No known drug allergies No family history on file.  Social History Social History  Substance Use Topics  . Smoking status: Current Every Day Smoker -- 1.00 packs/day    Types: Cigarettes  . Smokeless tobacco: None  . Alcohol Use: No    Review of Systems  Constitutional: Negative for fever. Eyes:  Negative for visual changes. ENT: Negative for sore throat. Positive for dental pain and right facial swelling Cardiovascular: Negative for chest pain. Respiratory: Negative for shortness of breath. Gastrointestinal: Negative for abdominal pain, vomiting and diarrhea. Genitourinary: Negative for dysuria. Musculoskeletal: Negative for back pain. Skin: Negative for rash. Neurological: Negative for headaches, focal weakness or numbness.   10-point ROS otherwise negative.  ____________________________________________   PHYSICAL EXAM:  VITAL SIGNS: ED Triage Vitals  Enc Vitals Group     BP 02/25/15 2344 134/81 mmHg     Pulse Rate 02/25/15 2344 80     Resp 02/25/15 2344 18     Temp 02/25/15 2344 98.3 F (36.8 C)     Temp Source 02/25/15 2344 Oral     SpO2 02/25/15 2344 97 %     Weight 02/25/15 2344 175 lb (79.379 kg)     Height 02/25/15 2344  (1.651 m)     Head Cir --      Peak Flow --      Pain Score 02/25/15 2344 10     Pain Loc --      Pain Edu? --      Excl. in GC? --    Constitutional: Alert and oriented.  Well appearing and in no distress. Eyes: Conjunctivae are normal. PERRL. Normal extraocular movements. ENT   Head: Normocephalic and atraumatic.   Nose: No congestion/rhinnorhea.   Mouth/Throat: Mucous membranes are moist. Multiple dental caries noted. Posterior maxillary and mandibular molars decay with possible dental abscess on the maxillary molar.   Neck: No stridor. Hematological/Lymphatic/Immunilogical: No cervical lymphadenopathy. Cardiovascular: Normal rate, regular rhythm. Normal and symmetric distal pulses are present in all extremities. No murmurs, rubs, or gallops. Respiratory: Normal respiratory effort without tachypnea nor retractions. Breath sounds are clear and equal bilaterally. No wheezes/rales/rhonchi. Gastrointestinal: Soft and nontender. No distention. There is no CVA tenderness. Genitourinary: deferred Musculoskeletal: Nontender  with normal range of motion in all extremities. No joint effusions.  No lower extremity tenderness nor edema. Neurologic:  Normal speech and language. No gross focal neurologic deficits are appreciated. Speech is normal.  Skin:  Skin is warm, dry and intact. No rash noted.     INITIAL IMPRESSION / ASSESSMENT AND PLAN / ED COURSE  Pertinent labs & imaging results that were available during my care of the patient were reviewed by me and considered in my medical decision making (see chart for details).    ____________________________________________   FINAL CLINICAL IMPRESSION(S) / ED DIAGNOSES  Final diagnoses:  Dental abscess      Darci Current, MD 02/26/15 [phone]

## 2015-11-06 ENCOUNTER — Encounter: Payer: Self-pay | Admitting: Emergency Medicine

## 2015-11-06 ENCOUNTER — Emergency Department
Admission: EM | Admit: 2015-11-06 | Discharge: 2015-11-06 | Disposition: A | Discharge status: Home / Self Care | Payer: Self-pay | Attending: Emergency Medicine | Admitting: Emergency Medicine

## 2015-11-06 DIAGNOSIS — F17210 Nicotine dependence, cigarettes, uncomplicated: Secondary | ICD-10-CM | POA: Insufficient documentation

## 2015-11-06 DIAGNOSIS — R110 Nausea: Secondary | ICD-10-CM | POA: Insufficient documentation

## 2015-11-06 DIAGNOSIS — Z79899 Other long term (current) drug therapy: Secondary | ICD-10-CM | POA: Insufficient documentation

## 2015-11-06 LAB — URINALYSIS COMPLETE WITH MICROSCOPIC (ARMC ONLY)
Bilirubin Urine: NEGATIVE
Glucose, UA: NEGATIVE mg/dL
KETONES UR: NEGATIVE mg/dL
Nitrite: NEGATIVE
Protein, ur: NEGATIVE mg/dL
Specific Gravity, Urine: 1.017 (ref 1.005–1.030)
pH: 6 (ref 5.0–8.0)

## 2015-11-06 LAB — POCT PREGNANCY, URINE: Preg Test, Ur: NEGATIVE

## 2015-11-06 MED ORDER — ONDANSETRON 8 MG PO TBDP
ORAL_TABLET | ORAL | Status: AC
  Administered 2015-11-06: 8 mg via ORAL
  Filled 2015-11-06: qty 1

## 2015-11-06 MED ORDER — IBUPROFEN 800 MG PO TABS
ORAL_TABLET | ORAL | Status: AC
  Filled 2015-11-06: qty 1

## 2015-11-06 MED ORDER — ONDANSETRON 4 MG PO TBDP: 4.0000 mg | ORAL_TABLET | Freq: Three times a day (TID) | ORAL | 0 refills | Status: DC | PRN

## 2015-11-06 MED ORDER — ONDANSETRON 8 MG PO TBDP
8.0000 mg | ORAL_TABLET | Freq: Once | ORAL | Status: AC
  Administered 2015-11-06: 8 mg via ORAL

## 2015-11-06 MED ORDER — IBUPROFEN 800 MG PO TABS
800.0000 mg | ORAL_TABLET | Freq: Once | ORAL | Status: AC
  Administered 2015-11-06: 800 mg via ORAL

## 2015-11-06 NOTE — ED Provider Notes (Signed)
[facility] Emergency Department Provider Note  ____________________________________________  Time seen: Approximately 8:46 PM  I have reviewed the triage vital signs and the nursing notes.   HISTORY  Chief Complaint No chief complaint on file.    HPI Debra Blake is a 35 y.o. female who presents emergency Department for three-day history of nausea. Patient denies any emesis with this complaint. No fevers or chills, recent URI symptoms, abdominal pain, diarrhea, constipation, urinary symptoms. Patient denies any other symptoms associated with her nausea. Patient has been able to maintain good oral intake of solids and liquids. No medications prior to arrival. No changes in daily activities, foods, medications.   History reviewed. No pertinent past medical history.  There are no active problems to display for this patient.   Past Surgical History:  Procedure Laterality Date  . TUBAL LIGATION      Prior to Admission medications   Medication Sig Start Date End Date Taking? Authorizing Provider  amoxicillin (AMOXIL) 500 MG capsule Take 1 capsule (500 mg total) by mouth 2 (two) times daily. 02/26/15   Darci Current, MD  hydrOXYzine (ATARAX/VISTARIL) 50 MG tablet Take 1 tablet (50 mg total) by mouth 3 (three) times daily as needed. 06/20/14   Joni Reining, PA-C  medroxyPROGESTERone (DEPO-PROVERA) 150 MG/ML injection Inject 1 mL into the muscle every 3 (three) months. 08/05/14   Historical Provider, MD  methylPREDNISolone (MEDROL DOSEPAK) 4 MG TBPK tablet Take Tapered dose as directed 06/20/14   Joni Reining, PA-C  ondansetron (ZOFRAN-ODT) 4 MG disintegrating tablet Take 1 tablet (4 mg total) by mouth every 8 (eight) hours as needed for nausea or vomiting. 11/06/15   Delorise Royals Cuthriell, PA-C  penicillin v potassium (VEETID) 250 MG tablet Take 2 tablets (500 mg total) by mouth 3 (three) times daily. 08/13/14   Chinita Pester, FNP  traMADol (ULTRAM) 50 MG  tablet Take 1 tablet (50 mg total) by mouth every 6 (six) hours as needed. 02/26/15   Darci Current, MD    Allergies Review of patient's allergies indicates no known allergies.  No family history on file.  Social History Social History  Substance Use Topics  . Smoking status: Current Every Day Smoker    Packs/day: 1.00    Types: Cigarettes  . Smokeless tobacco: Never Used  . Alcohol use No     Review of Systems  Constitutional: No fever/chills Eyes: No visual changes. No discharge ENT: No upper respiratory complaints. Cardiovascular: no chest pain. Respiratory: no cough. No SOB. Gastrointestinal: No abdominal pain.  Positive for nausea but denies vomiting.  No diarrhea.  No constipation. Genitourinary: Negative for dysuria. No hematuria Musculoskeletal: Negative for musculoskeletal pain. Skin: Negative for rash, abrasions, lacerations, ecchymosis. Neurological: Negative for headaches, focal weakness or numbness. 10-point ROS otherwise negative.  ____________________________________________   PHYSICAL EXAM:  VITAL SIGNS: ED Triage Vitals  Enc Vitals Group     BP 11/06/15 2027 134/78     Pulse Rate 11/06/15 2027 80     Resp 11/06/15 2027 18     Temp 11/06/15 2027 98.6 F (37 C)     Temp Source 11/06/15 2027 Oral     SpO2 11/06/15 2027 97 %     Weight 11/06/15 2024 190 lb (86.2 kg)     Height 11/06/15 2024 5\' 5"  (1.651 m)     Head Circumference --      Peak Flow --      Pain Score --  Pain Loc --      Pain Edu? --      Excl. in GC? --      Constitutional: Alert and oriented. Well appearing and in no acute distress. Eyes: Conjunctivae are normal. PERRL. EOMI. Head: Atraumatic. ENT:      Ears:       Nose: No congestion/rhinnorhea.      Mouth/Throat: Mucous membranes are moist.  Neck: No stridor.   Hematological/Lymphatic/Immunilogical: No cervical lymphadenopathy. Cardiovascular: Normal rate, regular rhythm. Normal S1 and S2.  Good peripheral  circulation. Respiratory: Normal respiratory effort without tachypnea or retractions. Lungs CTAB. Good air entry to the bases with no decreased or absent breath sounds. Gastrointestinal: Bowel sounds 4 quadrants. Soft and nontender to palpation. No guarding or rigidity. No palpable masses. No distention. No CVA tenderness. Musculoskeletal: Full range of motion to all extremities. No gross deformities appreciated. Neurologic:  Normal speech and language. No gross focal neurologic deficits are appreciated.  Skin:  Skin is warm, dry and intact. No rash noted. Psychiatric: Mood and affect are normal. Speech and behavior are normal. Patient exhibits appropriate insight and judgement.   ____________________________________________   LABS (all labs ordered are listed, but only abnormal results are displayed)  Labs Reviewed  URINALYSIS COMPLETEWITH MICROSCOPIC ([facility] ONLY) - Abnormal; Notable for the following:       Result Value   Color, Urine YELLOW (*)    APPearance HAZY (*)    Hgb urine dipstick 3+ (*)    Leukocytes, UA 1+ (*)    Bacteria, UA RARE (*)    Squamous Epithelial / LPF 6-30 (*)    All other components within normal limits  POCT PREGNANCY, URINE   ____________________________________________  EKG   ____________________________________________  RADIOLOGY   No results found.  ____________________________________________    PROCEDURES  Procedure(s) performed:    Procedures    Medications  ondansetron (ZOFRAN-ODT) disintegrating tablet 8 mg (8 mg Oral Given 11/06/15 2111)  ibuprofen (ADVIL,MOTRIN) tablet 800 mg (800 mg Oral Given 11/06/15 2114)     ____________________________________________   INITIAL IMPRESSION / ASSESSMENT AND PLAN / ED COURSE  Pertinent labs & imaging results that were available during my care of the patient were reviewed by me and considered in my medical decision making (see chart for details).  Review of the [place] CSRS was  performed in accordance of the [facility] prior to dispensing any controlled drugs.  Clinical Course    Patient's diagnosis is consistent with nausea. She presented with no other symptoms. Patient's symptoms did resolve after 1 dose of Zofran. As such, no further workup as deemed necessary at this time. If symptoms should change patient will either return or see primary care for further evaluation.. Patient will be discharged home with prescriptions for Zofran. Patient is to follow up with primary care as needed or otherwise directed. Patient is given ED precautions to return to the ED for any worsening or new symptoms.     ____________________________________________  FINAL CLINICAL IMPRESSION(S) / ED DIAGNOSES  Final diagnoses:  Nausea      NEW MEDICATIONS STARTED DURING THIS VISIT:  New Prescriptions   ONDANSETRON (ZOFRAN-ODT) 4 MG DISINTEGRATING TABLET    Take 1 tablet (4 mg total) by mouth every 8 (eight) hours as needed for nausea or vomiting.        This chart was dictated using voice recognition software/Dragon. Despite best efforts to proofread, errors can occur which can change the meaning. Any change was purely unintentional.    Christiane Ha  D Cuthriell, PA-C 11/06/15 2222    Rockne MenghiniAnne-Caroline Norman, MD 11/07/15 0001

## 2015-11-06 NOTE — ED Triage Notes (Signed)
Patient ambulatory to triage with steady gait, without difficulty or distress noted;  Pt reports nausea x 3 days with generalized body aches; denies any abd pain or vomiting

## 2016-03-07 ENCOUNTER — Emergency Department
Admission: EM | Admit: 2016-03-07 | Discharge: 2016-03-07 | Disposition: A | Discharge status: Home / Self Care | Payer: Medicaid Other | Attending: Emergency Medicine | Admitting: Emergency Medicine

## 2016-03-07 ENCOUNTER — Emergency Department: Payer: Medicaid Other

## 2016-03-07 ENCOUNTER — Encounter: Payer: Self-pay | Admitting: Emergency Medicine

## 2016-03-07 DIAGNOSIS — Z79899 Other long term (current) drug therapy: Secondary | ICD-10-CM | POA: Insufficient documentation

## 2016-03-07 DIAGNOSIS — Y9389 Activity, other specified: Secondary | ICD-10-CM | POA: Insufficient documentation

## 2016-03-07 DIAGNOSIS — W19XXXA Unspecified fall, initial encounter: Secondary | ICD-10-CM

## 2016-03-07 DIAGNOSIS — Y929 Unspecified place or not applicable: Secondary | ICD-10-CM | POA: Insufficient documentation

## 2016-03-07 DIAGNOSIS — S52125A Nondisplaced fracture of head of left radius, initial encounter for closed fracture: Secondary | ICD-10-CM | POA: Insufficient documentation

## 2016-03-07 DIAGNOSIS — F17210 Nicotine dependence, cigarettes, uncomplicated: Secondary | ICD-10-CM | POA: Insufficient documentation

## 2016-03-07 DIAGNOSIS — Y999 Unspecified external cause status: Secondary | ICD-10-CM | POA: Insufficient documentation

## 2016-03-07 DIAGNOSIS — W010XXA Fall on same level from slipping, tripping and stumbling without subsequent striking against object, initial encounter: Secondary | ICD-10-CM | POA: Insufficient documentation

## 2016-03-07 MED ORDER — DOCUSATE SODIUM 100 MG PO CAPS: ORAL_CAPSULE | ORAL | 0 refills | Status: DC

## 2016-03-07 MED ORDER — OXYCODONE-ACETAMINOPHEN 5-325 MG PO TABS
2.0000 | ORAL_TABLET | Freq: Once | ORAL | Status: AC
  Administered 2016-03-07: 2 via ORAL
  Filled 2016-03-07: qty 2

## 2016-03-07 MED ORDER — OXYCODONE-ACETAMINOPHEN 5-325 MG PO TABS: 1.0000 | ORAL_TABLET | ORAL | 0 refills | Status: DC | PRN

## 2016-03-07 NOTE — ED Provider Notes (Signed)
[facility] Emergency Department Provider Note  ____________________________________________   First MD Initiated Contact with Patient 03/07/16 [phone]     (approximate)  I have reviewed the triage vital signs and the nursing notes.   HISTORY  Chief Complaint Extremity Pain    HPI Debra Blake is a 36 y.o. female with no significant chronic medical issues who presents for evaluation of acute onset left elbow pain after a fall earlier tonight.  She was playing with her 79-year-old daughter and running away when the daughter got tripped up in the patient's legs and caused her to fall on her outstretched left arm.  She also has an abrasion on her left knee.  She had acute onset of moderate pain which is increased to severe.  She has normal sensation in her hand and arm but the pain is severe and throbbing and located at the proximal part of her forearm.  She has worsening pain with range of motion of the elbow.  Holding it in 90 of flexion helps it feel better.She did not strike her head, lose consciousness, nor have any neck pain.  She is right-hand dominant.  She does not have a significant amount of swelling.   History reviewed. No pertinent past medical history.  There are no active problems to display for this patient.   Past Surgical History:  Procedure Laterality Date  . TUBAL LIGATION      Prior to Admission medications   Medication Sig Start Date End Date Taking? Authorizing Provider  amoxicillin (AMOXIL) 500 MG capsule Take 1 capsule (500 mg total) by mouth 2 (two) times daily. 02/26/15   Darci Current, MD  docusate sodium (COLACE) 100 MG capsule Take 1 tablet once or twice daily as needed for constipation while taking narcotic pain medicine 03/07/16   Loleta Rose, MD  hydrOXYzine (ATARAX/VISTARIL) 50 MG tablet Take 1 tablet (50 mg total) by mouth 3 (three) times daily as needed. 06/20/14   Joni Reining, PA-C  medroxyPROGESTERone (DEPO-PROVERA)  150 MG/ML injection Inject 1 mL into the muscle every 3 (three) months. 08/05/14   Historical Provider, MD  methylPREDNISolone (MEDROL DOSEPAK) 4 MG TBPK tablet Take Tapered dose as directed 06/20/14   Joni Reining, PA-C  ondansetron (ZOFRAN-ODT) 4 MG disintegrating tablet Take 1 tablet (4 mg total) by mouth every 8 (eight) hours as needed for nausea or vomiting. 11/06/15   Delorise Royals Cuthriell, PA-C  oxyCODONE-acetaminophen (ROXICET) 5-325 MG tablet Take 1-2 tablets by mouth every 4 (four) hours as needed for severe pain. 03/07/16   Loleta Rose, MD  penicillin v potassium (VEETID) 250 MG tablet Take 2 tablets (500 mg total) by mouth 3 (three) times daily. 08/13/14   Chinita Pester, FNP  traMADol (ULTRAM) 50 MG tablet Take 1 tablet (50 mg total) by mouth every 6 (six) hours as needed. 02/26/15   Darci Current, MD    Allergies Patient has no known allergies.  No family history on file.  Social History Social History  Substance Use Topics  . Smoking status: Current Every Day Smoker    Packs/day: 1.00    Types: Cigarettes  . Smokeless tobacco: Never Used  . Alcohol use No    Review of Systems Constitutional: No fever/chills Eyes: No visual changes. ENT: No sore throat. Cardiovascular: Denies chest pain. Respiratory: Denies shortness of breath. Gastrointestinal: No abdominal pain.  No nausea, no vomiting.  No diarrhea.  No constipation. Genitourinary: Negative for dysuria. Musculoskeletal: Severe acute onset  pain in left elbow Skin: Negative for rash. Neurological: Negative for headaches, focal weakness or numbness even in the affected extremity  10-point ROS otherwise negative.  ____________________________________________   PHYSICAL EXAM:  VITAL SIGNS: ED Triage Vitals  Enc Vitals Group     BP 03/07/16 0044 110/72     Pulse Rate 03/07/16 0044 82     Resp 03/07/16 0044 18     Temp 03/07/16 0044 98.2 F (36.8 C)     Temp Source 03/07/16 0044 Oral     SpO2 03/07/16 0044  100 %     Weight 03/07/16 0041 190 lb (86.2 kg)     Height 03/07/16 0041 5\' 5"  (1.651 m)     Head Circumference --      Peak Flow --      Pain Score 03/07/16 0041 10     Pain Loc --      Pain Edu? --      Excl. in GC? --     Constitutional: Alert and oriented. Well appearing and in no acute distress. Eyes: Conjunctivae are normal. PERRL. EOMI. Head: Atraumatic. Neck: No stridor.  No meningeal signs.  No cervical spine tenderness to palpation. Cardiovascular: Normal rate, regular rhythm. Good peripheral circulation.  Respiratory: Normal respiratory effort.  No retractions.  Musculoskeletal: There is no significant swelling to the left upper extremity but she does have severe tenderness to palpation of the proximal radius.  There is not a significant amount of tenderness in the distal and posterior humerus and there is no large joint effusion.  She is neurovascularly intact distally and has normal grip strength although gripping my hand does reproduce the pain in her elbow Neurologic:  Normal speech and language. No gross focal neurologic deficits are appreciated.  Skin:  Skin is warm, dry and intact except for abrasions/friction burns on LLE Psychiatric: Mood and affect are normal. Speech and behavior are normal.  ____________________________________________   LABS (all labs ordered are listed, but only abnormal results are displayed)  Labs Reviewed - No data to display ____________________________________________  EKG  None - EKG not ordered by ED physician ____________________________________________  RADIOLOGY   Dg Elbow Complete Left  Result Date: 03/07/2016 CLINICAL DATA:  Initial evaluation for acute trauma, fall. EXAM: LEFT ELBOW - COMPLETE 3+ VIEW COMPARISON:  None. FINDINGS: There is cortical irregularity with linear lucency extending through the radial head, compatible with acute radial head fracture. No significant displacement. Associated joint effusion present. No  other acute fracture. Proximal ulna intact. Distal humerus intact. IMPRESSION: 1. Acute nondisplaced left radial head fracture. 2. Joint effusion. Electronically Signed   By: Rise Mu M.D.   On: 03/07/2016 01:37    ____________________________________________   PROCEDURES  Procedure(s) performed:   .Splint Application Date/Time: 03/07/2016 2:43 AM Performed by: Loleta Rose Authorized by: Loleta Rose   Consent:    Consent obtained:  Verbal   Consent given by:  Patient Procedure details:    Laterality:  Left   Location:  Elbow   Elbow:  L elbow   Splint type:  Long arm (posterior long arm at 90 degrees elbow flexion)   Supplies:  Ortho-Glass Post-procedure details:    Pain:  Unchanged   Sensation:  Normal   Patient tolerance of procedure:  Tolerated well, no immediate complications Comments:     applied by ED staff     Critical Care performed: No ____________________________________________   INITIAL IMPRESSION / ASSESSMENT AND PLAN / ED COURSE  Pertinent labs & imaging results  that were available during my care of the patient were reviewed by me and considered in my medical decision making (see chart for details).  Nondisplaced left radial head fracture.  No indication for effusion aspiration because there is not a significant joint effusion.  I will place her in a long arm posterior splint in 90 of flexion and have her follow-up with orthopedics.  I gave my usual and customary return precautions.      Clinical Course as of Mar 07 552  Sun Mar 07, 2016  0431 Ambulatory without difficulty, pain well controlled, splint is appropriate, and arm is N/V intact.  Discharged as described above  [CF]    Clinical Course User Index [CF] Loleta Roseory Amberley Hamler, MD    ____________________________________________  FINAL CLINICAL IMPRESSION(S) / ED DIAGNOSES  Final diagnoses:  Closed nondisplaced fracture of head of left radius, initial encounter  Fall, initial  encounter     MEDICATIONS GIVEN DURING THIS VISIT:  Medications  oxyCODONE-acetaminophen (PERCOCET/ROXICET) 5-325 MG per tablet 2 tablet (2 tablets Oral Given 03/07/16 0313)     NEW OUTPATIENT MEDICATIONS STARTED DURING THIS VISIT:  Discharge Medication List as of 03/07/2016  4:31 AM    START taking these medications   Details  docusate sodium (COLACE) 100 MG capsule Take 1 tablet once or twice daily as needed for constipation while taking narcotic pain medicine, Print    oxyCODONE-acetaminophen (ROXICET) 5-325 MG tablet Take 1-2 tablets by mouth every 4 (four) hours as needed for severe pain., Starting Sun 03/07/2016, Print        Discharge Medication List as of 03/07/2016  4:31 AM      Discharge Medication List as of 03/07/2016  4:31 AM       Note:  This document was prepared using Dragon voice recognition software and may include unintentional dictation errors.    Loleta Roseory Trinna Kunst, MD 03/07/16 [phone]

## 2016-03-07 NOTE — ED Notes (Signed)
Pt. States she tripped carrying a stepping stool.  Pt. States she fell on lt. Arm at around 20:00 this evening.  Pt. States she went to bed and woke up around midnight with increased pain.  No deformity noted.

## 2016-03-07 NOTE — Discharge Instructions (Signed)
Please keep your splint clean and dry.  Try to keep your elbow as immobile as possible.  When possible, keep your arm elevated (either on pillows or just keep your arm raised).  Use ice packs on the elbow and the proximal radius (upper part of your forearm) to keep the swelling down.    Call the office of Dr. Martha ClanKrasinski on Monday morning to schedule a follow up appointment for this coming week with him or one of his partners.  Use over the counter ibuprofen and/or Tylenol as needed for pain control.  Take Percocet as prescribed for severe pain. Do not drink alcohol, drive or participate in any other potentially dangerous activities while taking this medication as it may make you sleepy. Do not take this medication with any other sedating medications, either prescription or over-the-counter. If you were prescribed Percocet or Vicodin, do not take these with acetaminophen (Tylenol) as it is already contained within these medications.   This medication is an opiate (or narcotic) pain medication and can be habit forming.  Use it as little as possible to achieve adequate pain control.  Do not use or use it with extreme caution if you have a history of opiate abuse or dependence.  If you are on a pain contract with your primary care doctor or a pain specialist, be sure to let them know you were prescribed this medication today from the [facility].  This medication is intended for your use only - do not give any to anyone else and keep it in a secure place where nobody else, especially children, have access to it.  It will also cause or worsen constipation, so you may want to consider taking an over-the-counter stool softener while you are taking this medication.

## 2016-03-07 NOTE — ED Triage Notes (Signed)
Pt states that she was playing with her two year old around 2000 and fell onto her left arm. Pt reports that the pain has become increasingly worse. Pt is ambulatory to triage with NAD noted at this time.

## 2016-03-07 NOTE — ED Notes (Signed)
Pt. Going home with family. 

## 2016-03-07 NOTE — ED Notes (Signed)
Pt. States she took 800 mg of ibuprofen.

## 2016-04-13 ENCOUNTER — Encounter: Payer: Self-pay | Admitting: Emergency Medicine

## 2016-04-13 ENCOUNTER — Emergency Department
Admission: EM | Admit: 2016-04-13 | Discharge: 2016-04-13 | Disposition: A | Discharge status: Home / Self Care | Payer: Medicaid Other | Attending: Student in an Organized Health Care Education/Training Program | Admitting: Student in an Organized Health Care Education/Training Program

## 2016-04-13 ENCOUNTER — Emergency Department: Payer: Medicaid Other

## 2016-04-13 DIAGNOSIS — S52122D Displaced fracture of head of left radius, subsequent encounter for closed fracture with routine healing: Secondary | ICD-10-CM | POA: Insufficient documentation

## 2016-04-13 DIAGNOSIS — W19XXXD Unspecified fall, subsequent encounter: Secondary | ICD-10-CM | POA: Insufficient documentation

## 2016-04-13 DIAGNOSIS — F17210 Nicotine dependence, cigarettes, uncomplicated: Secondary | ICD-10-CM | POA: Insufficient documentation

## 2016-04-13 MED ORDER — MELOXICAM 7.5 MG PO TABS
15.0000 mg | ORAL_TABLET | Freq: Once | ORAL | Status: AC
  Administered 2016-04-13: 15 mg via ORAL
  Filled 2016-04-13: qty 2

## 2016-04-13 MED ORDER — MELOXICAM 15 MG PO TABS: 15.0000 mg | ORAL_TABLET | Freq: Every day | ORAL | 0 refills | Status: AC

## 2016-04-13 NOTE — ED Triage Notes (Addendum)
States she fell approx 6 weeks ago and was dx'd with fx to left arm  Went for f/u once  But conts to have pain. denies any new injury. States limited movement d/t pain  Has been taking ibu 800 mg w/o relief

## 2016-04-13 NOTE — ED Notes (Signed)
X-ray at bedside

## 2016-04-13 NOTE — ED Provider Notes (Signed)
[facility] Emergency Department Provider Note  ____________________________________________  Time seen: Approximately 6:02 PM  I have reviewed the triage vital signs and the nursing notes.   HISTORY  Chief Complaint Arm Pain    HPI Debra Blake is a 36 y.o. female presents to emergency department with continued left elbow pain after fracturing it 8 weeks ago. Patient states she is unable to extend her left arm fully. She has pain throughout her forearm. Pain worsened today after lifting clothes baskets. She has full use of her hand and denies any numbness or tingling. Patient states that she followed up with Altamese Cabal PA-C  1 week after injury. She was told to return to orthopedics on March 16 if pain continued. She states that on March 16, she did not have $250 to go to an orthopedic appointment. Patient states that she is out of her oxycodone and would like something more for pain. Patient denies fever, shortness of breath, chest pain, nausea, vomiting, abdominal pain.   History reviewed. No pertinent past medical history.  There are no active problems to display for this patient.   Past Surgical History:  Procedure Laterality Date  . TUBAL LIGATION      Prior to Admission medications   Medication Sig Start Date End Date Taking? Authorizing Provider  docusate sodium (COLACE) 100 MG capsule Take 1 tablet once or twice daily as needed for constipation while taking narcotic pain medicine 03/07/16   Loleta Rose, MD  medroxyPROGESTERone (DEPO-PROVERA) 150 MG/ML injection Inject 1 mL into the muscle every 3 (three) months. 08/05/14   Historical Provider, MD  meloxicam (MOBIC) 15 MG tablet Take 1 tablet (15 mg total) by mouth daily. 04/13/16 04/23/16  Enid Derry, PA-C    Allergies Patient has no known allergies.  No family history on file.  Social History Social History  Substance Use Topics  . Smoking status: Current Every Day Smoker   Packs/day: 1.00    Types: Cigarettes  . Smokeless tobacco: Never Used  . Alcohol use No     Review of Systems  Constitutional: No fever/chills ENT: No upper respiratory complaints. Cardiovascular: No chest pain. Respiratory: No SOB. Gastrointestinal: No abdominal pain.  No nausea, no vomiting.  Musculoskeletal: Positive for left elbow pain. Skin: Negative for rash, abrasions, lacerations, ecchymosis. Neurological: Negative for headaches, numbness or tingling   ____________________________________________   PHYSICAL EXAM:  VITAL SIGNS: ED Triage Vitals  Enc Vitals Group     BP 04/13/16 1744 118/75     Pulse Rate 04/13/16 1744 80     Resp 04/13/16 1744 18     Temp 04/13/16 1744 98.5 F (36.9 C)     Temp Source 04/13/16 1744 Oral     SpO2 04/13/16 1744 99 %     Weight 04/13/16 1745 190 lb (86.2 kg)     Height 04/13/16 1745 5\' 5"  (1.651 m)     Head Circumference --      Peak Flow --      Pain Score --      Pain Loc --      Pain Edu? --      Excl. in GC? --      Constitutional: Alert and oriented. Well appearing and in no acute distress. Eyes: Conjunctivae are normal. PERRL. EOMI. Head: Atraumatic. ENT:      Ears:      Nose: No congestion/rhinnorhea.      Mouth/Throat: Mucous membranes are moist.  Neck: No stridor.  Cardiovascular: Normal  rate, regular rhythm.  Good peripheral circulation. 2+ plus radial pulses. Respiratory: Normal respiratory effort without tachypnea or retractions. Lungs CTAB. Good air entry to the bases with no decreased or absent breath sounds. Musculoskeletal: No gross deformities appreciated. Tenderness to palpation in left antecubital fossa. Pain with extension of left arm. Neurologic:  Normal speech and language. No gross focal neurologic deficits are appreciated.  Skin:  Skin is warm, dry and intact. No rash noted.   ____________________________________________   LABS (all labs ordered are listed, but only abnormal results are  displayed)  Labs Reviewed - No data to display ____________________________________________  EKG   ____________________________________________  RADIOLOGY Lexine BatonI, Alexxus Sobh, personally viewed and evaluated these images (plain radiographs) as part of my medical decision making, as well as reviewing the written report by the radiologist.  Dg Elbow Complete Left  Result Date: 04/13/2016 CLINICAL DATA:  Persistent left elbow pain. Known radial head fracture sustained in approximately 6 weeks ago after fall. EXAM: LEFT ELBOW - COMPLETE 3+ VIEW COMPARISON:  03/07/2016 FINDINGS: Expected evolution of radial head fracture with more distinct fracture margins now noted. The radial head fracture fragment is minimally displaced but less than 2 mm. No dislocation. No new fracture. No bone destruction. Persistent small elbow joint effusion characterized elevated fat pad signs. IMPRESSION: 1. Elbow joint effusion. 2. Known category 1 bordering on category 2 Mason-Johnston classification fracture of the radial head. Electronically Signed   By: Tollie Ethavid  Kwon M.D.   On: 04/13/2016 18:46    ____________________________________________    PROCEDURES  Procedure(s) performed:    Procedures    Medications  meloxicam (MOBIC) tablet 15 mg (15 mg Oral Given 04/13/16 1944)     ____________________________________________   INITIAL IMPRESSION / ASSESSMENT AND PLAN / ED COURSE  Pertinent labs & imaging results that were available during my care of the patient were reviewed by me and considered in my medical decision making (see chart for details).  Review of the [place] CSRS was performed in accordance of the [facility] prior to dispensing any controlled drugs.     Patient's diagnosis is consistent with head of left radius fracture. Vital signs and exam are reassuring. No new injury. Patient's arm was wrapped with Ace bandage. Patient was educated that she needs to follow up with orthopedics. Patient will be  discharged home with prescriptions for meloxicam. Patient is given ED precautions to return to the ED for any worsening or new symptoms.     ____________________________________________  FINAL CLINICAL IMPRESSION(S) / ED DIAGNOSES  Final diagnoses:  Closed displaced fracture of head of left radius with routine healing, subsequent encounter      NEW MEDICATIONS STARTED DURING THIS VISIT:  Discharge Medication List as of 04/13/2016  7:40 PM    START taking these medications   Details  meloxicam (MOBIC) 15 MG tablet Take 1 tablet (15 mg total) by mouth daily., Starting Tue 04/13/2016, Until Fri 04/23/2016, Print            This chart was dictated using voice recognition software/Dragon. Despite best efforts to proofread, errors can occur which can change the meaning. Any change was purely unintentional.    Enid DerryAshley Jacqualine Weichel, PA-C 04/13/16 2352    Willy EddyPatrick Robinson, MD 04/14/16 [phone]

## 2017-12-20 IMAGING — DX DG ELBOW COMPLETE 3+V*L*
4 series · 4 of 4 positions shown · non-contrast
Comparison: 03/07/2016

CLINICAL DATA: Persistent left elbow pain. Known radial head
fracture sustained in approximately 6 weeks ago after fall.

EXAM:
LEFT ELBOW - COMPLETE 3+ VIEW

[elbow ap]
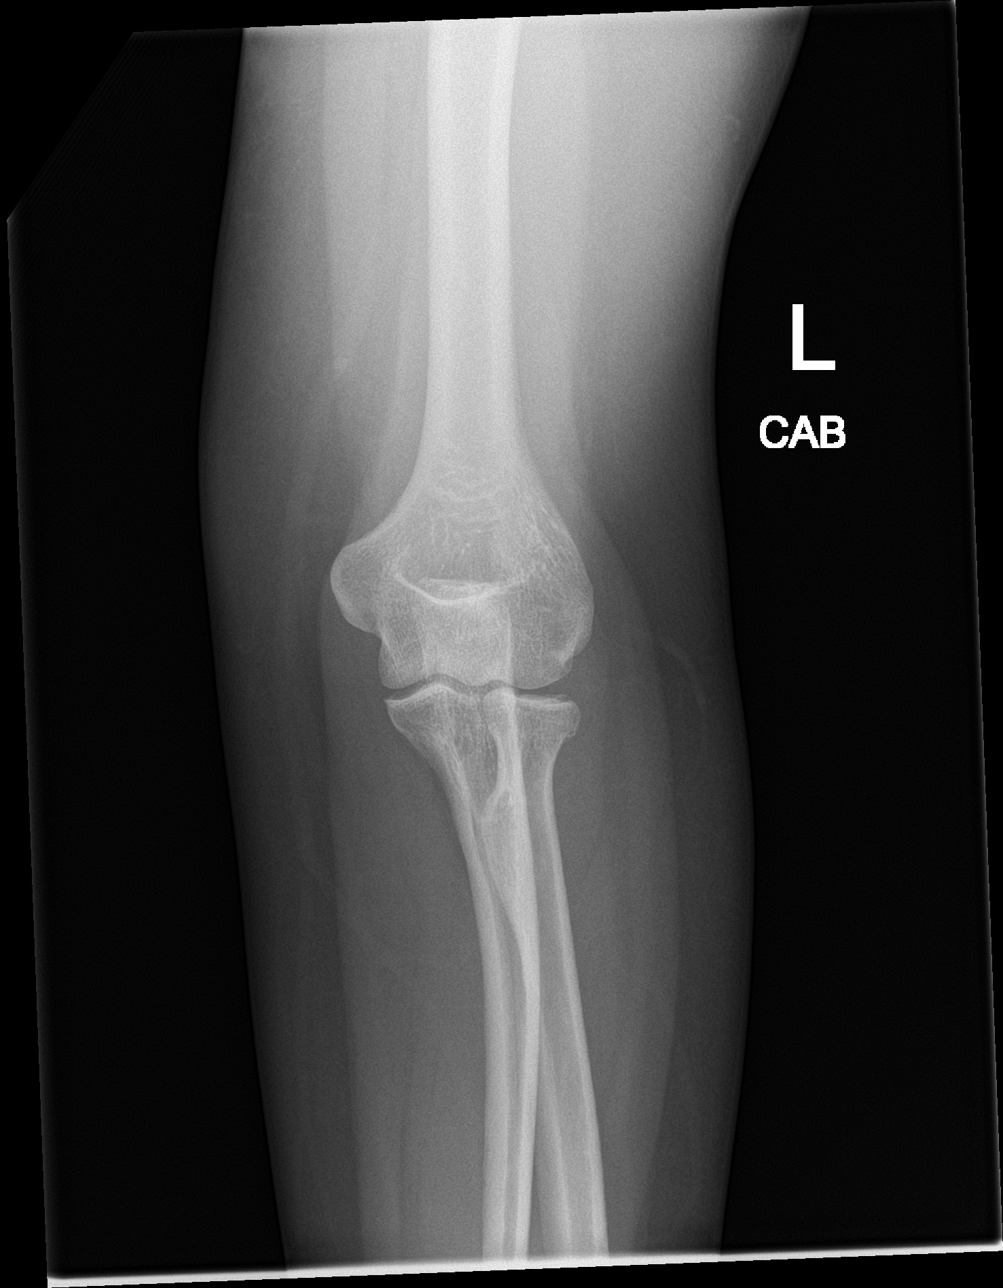

[elbow obl (1 of 2)]
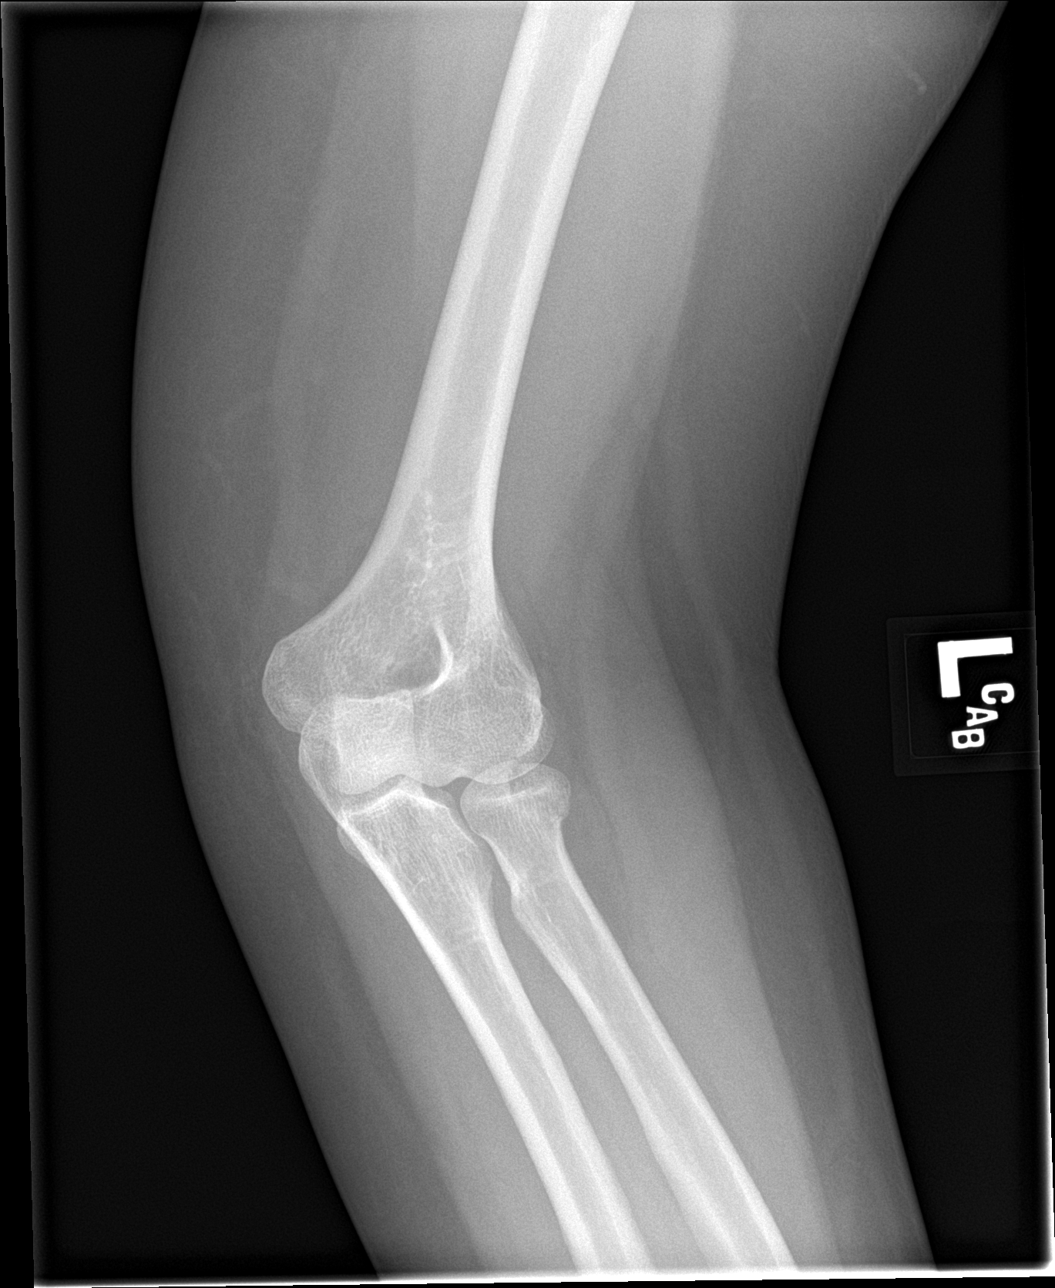

[elbow obl (2 of 2)]
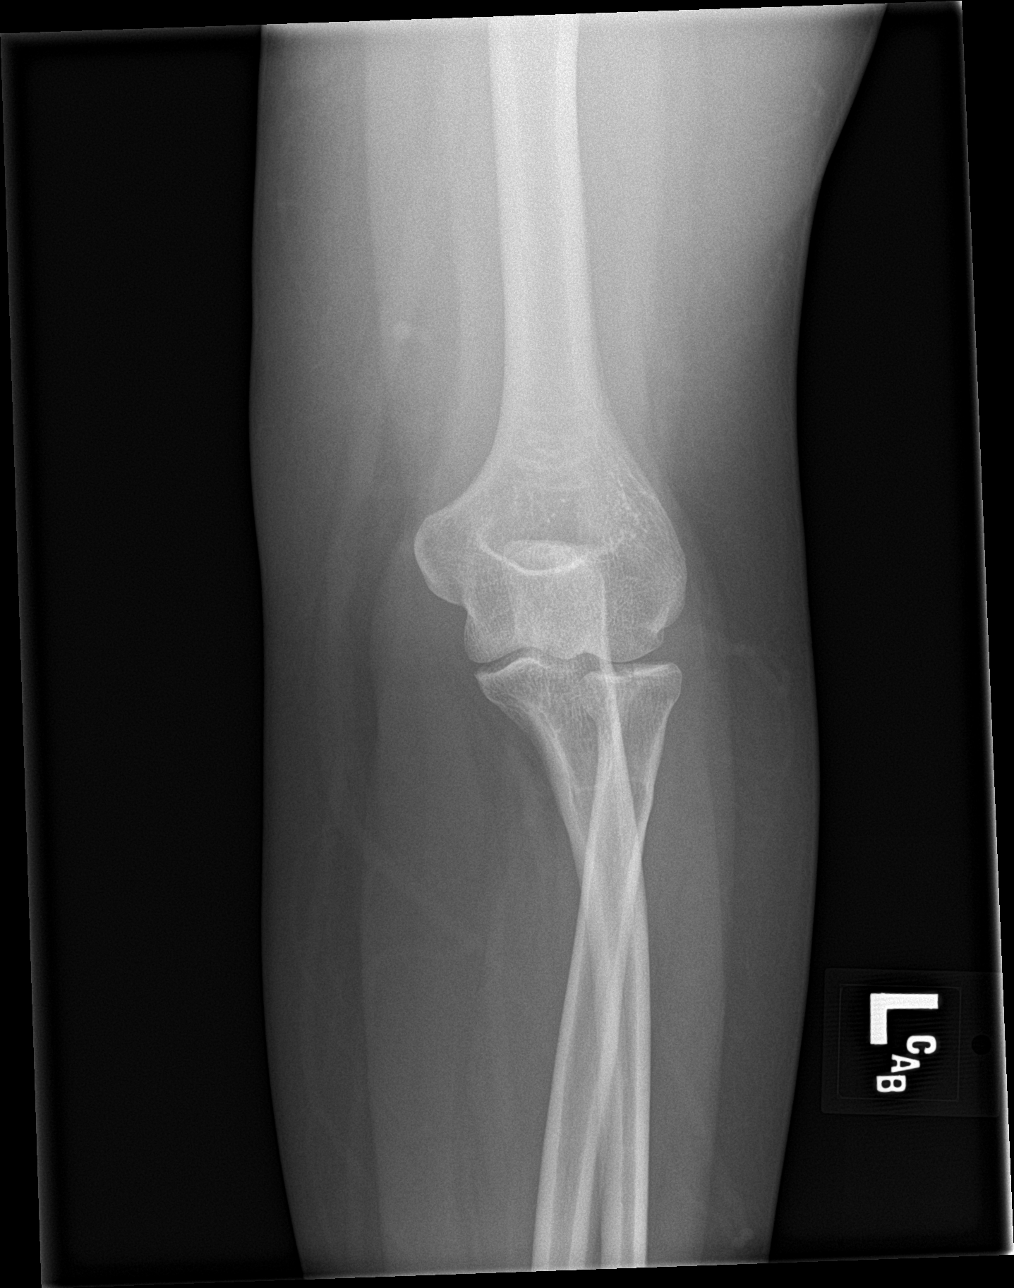

[elbow lat]
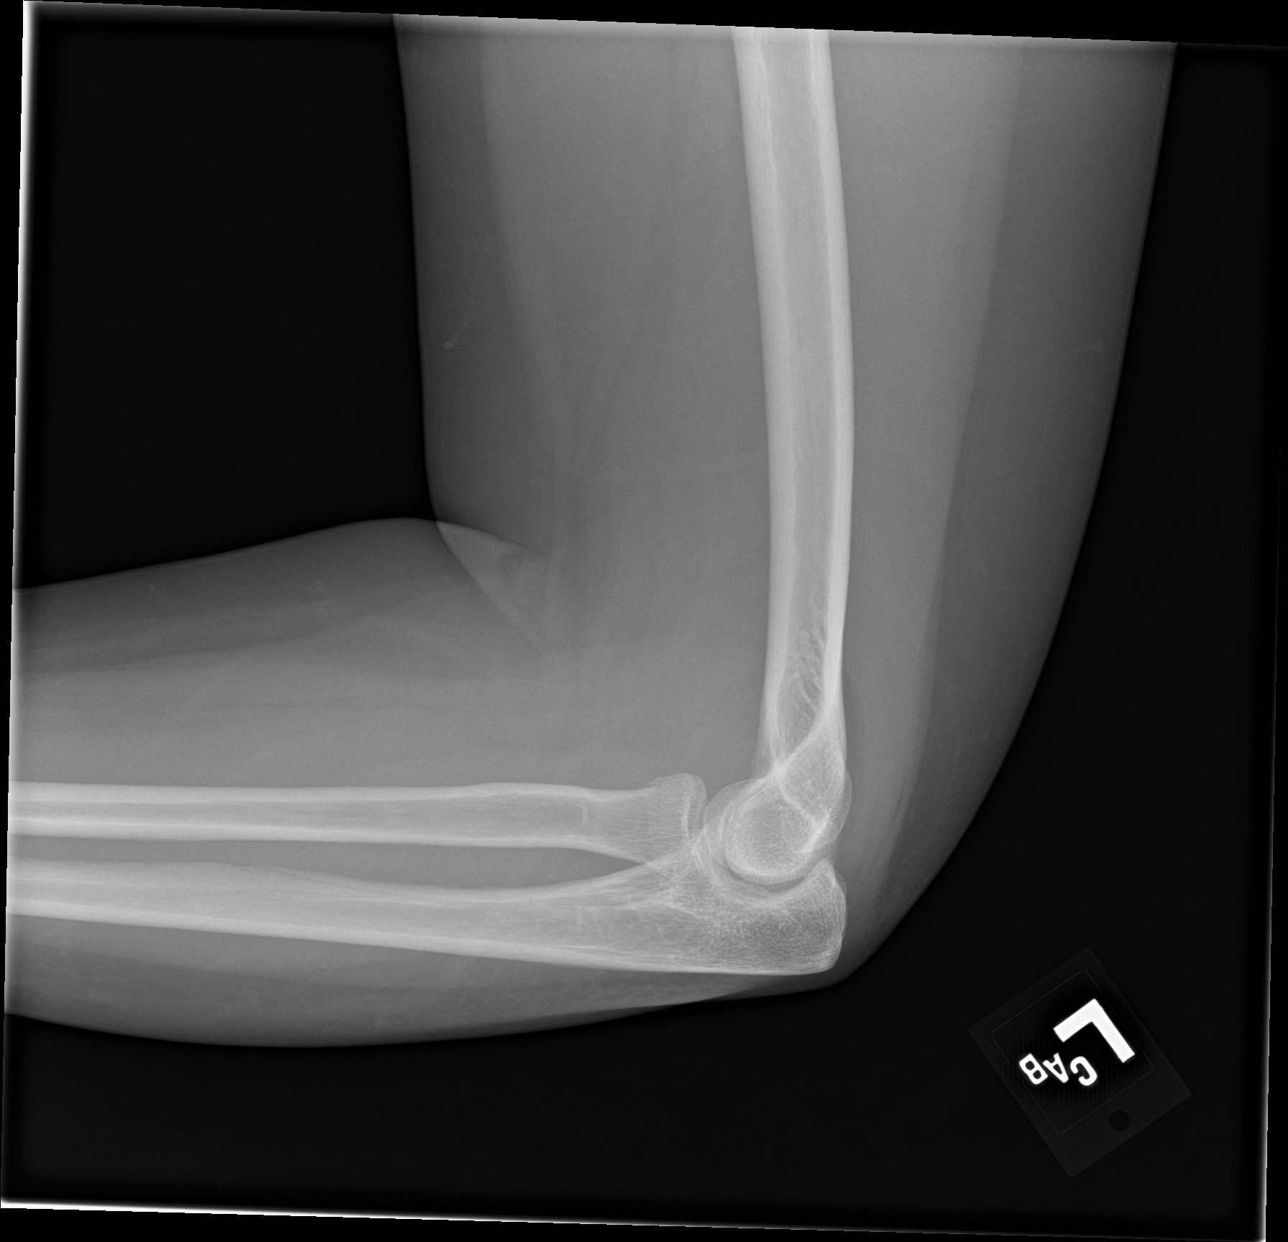

[4 of 4 positions shown; findings below may reference images not displayed]

FINDINGS: Expected evolution of radial head fracture with more distinct
fracture margins now noted. The radial head fracture fragment is
minimally displaced but less than 2 mm. No dislocation. No new
fracture. No bone destruction. Persistent small elbow joint effusion
characterized elevated fat pad signs.
IMPRESSION: 1. Elbow joint effusion.
2. Known category 1 bordering on category 2 Mares
classification fracture of the radial head.

## 2018-02-14 ENCOUNTER — Encounter: Payer: Self-pay | Admitting: Medical Oncology

## 2018-02-14 ENCOUNTER — Emergency Department
Admission: EM | Admit: 2018-02-14 | Discharge: 2018-02-14 | Disposition: A | Discharge status: Home / Self Care | Payer: Self-pay | Attending: Student in an Organized Health Care Education/Training Program | Admitting: Student in an Organized Health Care Education/Training Program

## 2018-02-14 DIAGNOSIS — F17210 Nicotine dependence, cigarettes, uncomplicated: Secondary | ICD-10-CM | POA: Insufficient documentation

## 2018-02-14 DIAGNOSIS — Z79899 Other long term (current) drug therapy: Secondary | ICD-10-CM | POA: Insufficient documentation

## 2018-02-14 DIAGNOSIS — L309 Dermatitis, unspecified: Secondary | ICD-10-CM | POA: Insufficient documentation

## 2018-02-14 NOTE — ED Triage Notes (Addendum)
Pt reports that she has been noticing some white patches to side of nose and behind ears. Pt denies itching. Pt stated "my boyfriend thinks that it is semen and I'm trying to prove a point to him that it is dry skin".

## 2018-02-14 NOTE — ED Provider Notes (Signed)
[facility] Emergency Department Provider Note  ____________________________________________   First MD Initiated Contact with Patient 02/14/18 1945     (approximate)  I have reviewed the triage vital signs and the nursing notes.   HISTORY  Chief Complaint Rash    HPI Debra Blake is a 38 y.o. female presents emergency department complaining of flaky skin around her nose and in her ears.  She states she is mainly here to prove to her boyfriend that this is not semen on her face.  She states he keeps accusing her of it being dried semen around her nose and in her ears.     History reviewed. No pertinent past medical history.  There are no active problems to display for this patient.   Past Surgical History:  Procedure Laterality Date  . TUBAL LIGATION      Prior to Admission medications   Medication Sig Start Date End Date Taking? Authorizing Provider  docusate sodium (COLACE) 100 MG capsule Take 1 tablet once or twice daily as needed for constipation while taking narcotic pain medicine 03/07/16   Loleta Rose, MD  medroxyPROGESTERone (DEPO-PROVERA) 150 MG/ML injection Inject 1 mL into the muscle every 3 (three) months. 08/05/14   [provider]    Allergies Patient has no known allergies.  No family history on file.  Social History Social History   Tobacco Use  . Smoking status: Current Every Day Smoker    Packs/day: 1.00    Types: Cigarettes  . Smokeless tobacco: Never Used  Substance Use Topics  . Alcohol use: No  . Drug use: No    Review of Systems  Constitutional: No fever/chills Eyes: No visual changes. ENT: No sore throat. Respiratory: Denies cough Genitourinary: Negative for dysuria. Musculoskeletal: Negative for back pain. Skin: Negative for rash.  Positive for dry flaky skin    ____________________________________________   PHYSICAL EXAM:  VITAL SIGNS: ED Triage Vitals  Enc Vitals Group     BP  02/14/18 1828 122/82     Pulse Rate 02/14/18 1828 74     Resp 02/14/18 1828 16     Temp 02/14/18 1828 97.9 F (36.6 C)     Temp Source 02/14/18 1828 Oral     SpO2 02/14/18 1828 100 %     Weight 02/14/18 1825 189 lb 9.5 oz (86 kg)     Height --      Head Circumference --      Peak Flow --      Pain Score 02/14/18 1825 0     Pain Loc --      Pain Edu? --      Excl. in GC? --     Constitutional: Alert and oriented. Well appearing and in no acute distress. Eyes: Conjunctivae are normal.  Head: Atraumatic. Ears: Ears appear normal, there is some dry wax/skin at the anterior of the ear canals. Nose: No congestion/rhinnorhea.  Positive for dry flaky skin noted at the creases of the nose Mouth/Throat: Mucous membranes are moist.   Neck:  supple no lymphadenopathy noted Cardiovascular: Normal rate, regular rhythm. Heart sounds are normal Respiratory: Normal respiratory effort.  No retractions, lungs c t a  GU: deferred Musculoskeletal: FROM all extremities, warm and well perfused Neurologic:  Normal speech and language.  Skin:  Skin is warm, dry and intact. No rash noted. Psychiatric: Mood and affect are normal. Speech and behavior are normal.  ____________________________________________   LABS (all labs ordered are listed, but only abnormal  results are displayed)  Labs Reviewed - No data to display ____________________________________________   ____________________________________________  RADIOLOGY    ____________________________________________   PROCEDURES  Procedure(s) performed: No  Procedures    ____________________________________________   INITIAL IMPRESSION / ASSESSMENT AND PLAN / ED COURSE  Pertinent labs & imaging results that were available during my care of the patient were reviewed by me and considered in my medical decision making (see chart for details).   Patient was diagnosed with eczema.  She is to follow-up with your dermatologist as  needed.  Try over-the-counter hydrocortisone cream and Cetaphil lotion.  She states she understands will comply.  She discharged stable condition.     As part of my medical decision making, I reviewed the following data within the electronic MEDICAL RECORD NUMBER Nursing notes reviewed and incorporated, Notes from prior ED visits and [place] Controlled Substance Database  ____________________________________________   FINAL CLINICAL IMPRESSION(S) / ED DIAGNOSES  Final diagnoses:  Eczema, unspecified type      NEW MEDICATIONS STARTED DURING THIS VISIT:  Discharge Medication List as of 02/14/2018  7:51 PM       Note:  This document was prepared using Dragon voice recognition software and may include unintentional dictation errors.    Faythe GheeFisher, Yuriana Gaal W, PA-C 02/14/18 2046    Willy Eddyobinson, Patrick, MD 02/14/18 2213

## 2018-02-14 NOTE — Discharge Instructions (Addendum)
Follow-up with a dermatologist if the areas are not improving in 5 to 7 days.  Use a small amount of hydrocortisone cream followed by Cetaphil lotion.  This should help with some of the dryness surrounding your nose.

## 2021-12-28 DIAGNOSIS — R946 Abnormal results of thyroid function studies: Secondary | ICD-10-CM | POA: Diagnosis not present

## 2021-12-28 DIAGNOSIS — R7303 Prediabetes: Secondary | ICD-10-CM | POA: Diagnosis not present

## 2021-12-28 DIAGNOSIS — M545 Low back pain, unspecified: Secondary | ICD-10-CM | POA: Diagnosis not present

## 2021-12-28 DIAGNOSIS — E782 Mixed hyperlipidemia: Secondary | ICD-10-CM | POA: Diagnosis not present

## 2021-12-28 DIAGNOSIS — D519 Vitamin B12 deficiency anemia, unspecified: Secondary | ICD-10-CM | POA: Diagnosis not present

## 2021-12-28 DIAGNOSIS — N390 Urinary tract infection, site not specified: Secondary | ICD-10-CM | POA: Diagnosis not present

## 2021-12-28 DIAGNOSIS — E559 Vitamin D deficiency, unspecified: Secondary | ICD-10-CM | POA: Diagnosis not present

## 2021-12-28 DIAGNOSIS — R5383 Other fatigue: Secondary | ICD-10-CM | POA: Diagnosis not present

## 2021-12-28 DIAGNOSIS — R350 Frequency of micturition: Secondary | ICD-10-CM | POA: Diagnosis not present

## 2022-01-25 DIAGNOSIS — R799 Abnormal finding of blood chemistry, unspecified: Secondary | ICD-10-CM | POA: Diagnosis not present

## 2022-01-25 DIAGNOSIS — R5383 Other fatigue: Secondary | ICD-10-CM | POA: Diagnosis not present

## 2022-01-25 DIAGNOSIS — E039 Hypothyroidism, unspecified: Secondary | ICD-10-CM | POA: Diagnosis not present

## 2022-01-25 DIAGNOSIS — R946 Abnormal results of thyroid function studies: Secondary | ICD-10-CM | POA: Diagnosis not present

## 2022-02-08 ENCOUNTER — Ambulatory Visit
Admission: RE | Admit: 2022-02-08 | Discharge: 2022-02-08 | Disposition: A | Discharge status: Home / Self Care | Payer: Medicaid Other | Source: Ambulatory Visit | Attending: Family | Admitting: Family

## 2022-02-08 ENCOUNTER — Ambulatory Visit
Admission: RE | Admit: 2022-02-08 | Discharge: 2022-02-08 | Disposition: A | Discharge status: Home / Self Care | Payer: Medicaid Other | Attending: Family | Admitting: Family

## 2022-02-08 ENCOUNTER — Other Ambulatory Visit: Payer: Self-pay | Admitting: Family

## 2022-02-08 DIAGNOSIS — E039 Hypothyroidism, unspecified: Secondary | ICD-10-CM | POA: Diagnosis not present

## 2022-02-08 DIAGNOSIS — R0602 Shortness of breath: Secondary | ICD-10-CM | POA: Diagnosis not present

## 2022-02-08 DIAGNOSIS — R0600 Dyspnea, unspecified: Secondary | ICD-10-CM

## 2022-03-15 ENCOUNTER — Ambulatory Visit: Payer: Medicaid Other | Admitting: Family

## 2022-03-22 ENCOUNTER — Encounter: Payer: Self-pay | Admitting: Family

## 2022-03-22 ENCOUNTER — Ambulatory Visit: Payer: Medicaid Other | Admitting: Family

## 2022-03-22 VITALS — BP 124/80 | HR 92 | Ht 65.0 in | Wt 215.0 lb

## 2022-03-22 DIAGNOSIS — F419 Anxiety disorder, unspecified: Secondary | ICD-10-CM

## 2022-03-22 DIAGNOSIS — E063 Autoimmune thyroiditis: Secondary | ICD-10-CM

## 2022-03-22 DIAGNOSIS — R350 Frequency of micturition: Secondary | ICD-10-CM

## 2022-03-22 DIAGNOSIS — R946 Abnormal results of thyroid function studies: Secondary | ICD-10-CM | POA: Diagnosis not present

## 2022-03-22 LAB — POCT URINALYSIS DIPSTICK
Bilirubin, UA: NEGATIVE
Glucose, UA: NEGATIVE
Ketones, UA: NEGATIVE
Leukocytes, UA: NEGATIVE
Nitrite, UA: NEGATIVE
Protein, UA: POSITIVE — AB
Spec Grav, UA: >=1.03 — AB
Urobilinogen, UA: 0.2 U/dL
pH, UA: 5.5

## 2022-03-22 NOTE — Progress Notes (Signed)
Established Patient Office Visit  Subjective:  Patient ID: Debra Blake, female    DOB: [birth year]  Age: 42 y.o. MRN: [id number]  Chief Complaint  Patient presents with   Follow-up    5 week follow up    Pt. Here for her 6 week f/u.  She has been doing well in general, but says that she thinks she might need a higher dose on the thyroid medication.  She is also still having urinary frequency, asks if there is anything we can do to decrease this.   No other concerns at this time.      Past Medical History:  Diagnosis Date   Anxiety    Autoimmune thyroiditis     Social History   Socioeconomic History   Marital status: Single    Spouse name: Not on file   Number of children: 3   Years of education: Not on file   Highest education level: Not on file  Occupational History   Not on file  Tobacco Use   Smoking status: Every Day    Packs/day: 1.00    Types: Cigarettes   Smokeless tobacco: Never  Substance and Sexual Activity   Alcohol use: No   Drug use: No   Sexual activity: Yes    Birth control/protection: Surgical  Other Topics Concern   Not on file  Social History Narrative   Not on file   Social Determinants of Health   Financial Resource Strain: Not on file  Food Insecurity: Not on file  Transportation Needs: Not on file  Physical Activity: Not on file  Stress: Not on file  Social Connections: Not on file  Intimate Partner Violence: Not on file    History reviewed. No pertinent family history.  No Known Allergies  Review of Systems  Genitourinary:  Positive for frequency and urgency. Negative for dysuria.  All other systems reviewed and are negative.      Objective:   BP 124/80   Pulse 92   Ht '5\' 5"'$  (1.651 m)   Wt 215 lb (97.5 kg)   SpO2 95%   BMI 35.78 kg/m   Vitals:   03/22/22 1405  BP: 124/80  Pulse: 92  Height: '5\' 5"'$  (1.651 m)  Weight: 215 lb (97.5 kg)  SpO2: 95%  BMI (Calculated): 35.78    Physical Exam Vitals  and nursing note reviewed.  Constitutional:      Appearance: Normal appearance. She is normal weight.  HENT:     Head: Normocephalic.  Eyes:     Pupils: Pupils are equal, round, and reactive to light.  Cardiovascular:     Rate and Rhythm: Normal rate.  Pulmonary:     Effort: Pulmonary effort is normal.  Neurological:     Mental Status: She is alert.      Results for orders placed or performed in visit on 03/22/22  POCT urinalysis dipstick  Result Value Ref Range   Color, UA     Clarity, UA     Glucose, UA Negative Negative   Bilirubin, UA Negative    Ketones, UA Negative    Spec Grav, UA >=1.030 (A) 1.010 - 1.025   Blood, UA Trace-Intact    pH, UA 5.5 5.0 - 8.0   Protein, UA Positive (A) Negative   Urobilinogen, UA 0.2 0.2 or 1.0 E.U./dL   Nitrite, UA Negative    Leukocytes, UA Negative Negative   Appearance     Odor      Recent Results (from  the past 2160 hour(s))  POCT urinalysis dipstick     Status: Abnormal   Collection Time: 03/22/22  2:25 PM  Result Value Ref Range   Color, UA     Clarity, UA     Glucose, UA Negative Negative   Bilirubin, UA Negative    Ketones, UA Negative    Spec Grav, UA >=1.030 (A) 1.010 - 1.025   Blood, UA Trace-Intact    pH, UA 5.5 5.0 - 8.0   Protein, UA Positive (A) Negative    Comment: Trace   Urobilinogen, UA 0.2 0.2 or 1.0 E.U./dL   Nitrite, UA Negative    Leukocytes, UA Negative Negative   Appearance     Odor        Assessment & Plan:   Problem List Items Addressed This Visit     Anxiety   Autoimmune thyroiditis   Relevant Medications   levothyroxine (SYNTHROID) 75 MCG tablet   Other Visit Diagnoses     Frequency of micturition    -  Primary   Relevant Orders   POCT urinalysis dipstick (Completed)   Abnormal thyroid function test       Relevant Orders   TSH+T4F+T3Free       Return as previously scheduled.   Total time spent: 20 minutes  Mechele Claude, FNP  03/22/2022

## 2022-03-23 LAB — TSH+T4F+T3FREE
Free T4: 1.12 ng/dL (ref 0.82–1.77)
T3, Free: 2.7 pg/mL (ref 2.0–4.4)
TSH: 4.86 u[IU]/mL — ABNORMAL HIGH (ref 0.450–4.500)

## 2022-03-29 ENCOUNTER — Telehealth: Payer: Self-pay

## 2022-03-29 ENCOUNTER — Ambulatory Visit: Payer: Self-pay | Admitting: Physician Assistant

## 2022-03-29 NOTE — Telephone Encounter (Signed)
Pt called and left vm regarding from lab results if you can send upped dose of levothyroxine in for her? Please advise

## 2022-03-30 MED ORDER — LEVOTHYROXINE SODIUM 125 MCG PO TABS: 125.0000 ug | ORAL_TABLET | Freq: Every day | ORAL | 1 refills | Status: DC

## 2022-03-30 NOTE — Addendum Note (Signed)
Addended by: Georgian Co on: 03/30/2022 09:10 AM   Modules accepted: Orders

## 2022-04-26 ENCOUNTER — Encounter: Payer: Self-pay | Admitting: Family

## 2022-04-26 ENCOUNTER — Ambulatory Visit: Payer: Medicaid Other | Admitting: Family

## 2022-04-26 VITALS — BP 120/70 | HR 82 | Ht 65.0 in | Wt 217.4 lb

## 2022-04-26 DIAGNOSIS — E782 Mixed hyperlipidemia: Secondary | ICD-10-CM | POA: Diagnosis not present

## 2022-04-26 DIAGNOSIS — E538 Deficiency of other specified B group vitamins: Secondary | ICD-10-CM

## 2022-04-26 DIAGNOSIS — E559 Vitamin D deficiency, unspecified: Secondary | ICD-10-CM | POA: Diagnosis not present

## 2022-04-26 DIAGNOSIS — E063 Autoimmune thyroiditis: Secondary | ICD-10-CM | POA: Diagnosis not present

## 2022-04-26 DIAGNOSIS — I10 Essential (primary) hypertension: Secondary | ICD-10-CM

## 2022-04-26 DIAGNOSIS — R7303 Prediabetes: Secondary | ICD-10-CM | POA: Diagnosis not present

## 2022-04-26 NOTE — Progress Notes (Addendum)
Established Patient Office Visit  Subjective:  Patient ID: Debra Blake, female    DOB: 04-27-1980  Age: 42 y.o. MRN: [id number]  Chief Complaint  Patient presents with   Follow-up    3 month follow up    Patient is here today for her 3 month follow up.  She has been feeling about the same since last appointment.   She does not have additional concerns to discuss today.   Labs are due today. She needs refills.   I have reviewed her active problem list, medication list, allergies, notes from last encounter, lab results for her appointment today.   No other concerns at this time.   Past Medical History:  Diagnosis Date   Anxiety    Autoimmune thyroiditis     Past Surgical History:  Procedure Laterality Date   TUBAL LIGATION  2015    Social History   Socioeconomic History   Marital status: Single    Spouse name: Not on file   Number of children: 3   Years of education: Not on file   Highest education level: Not on file  Occupational History   Not on file  Tobacco Use   Smoking status: Every Day    Packs/day: 1    Types: Cigarettes   Smokeless tobacco: Never  Substance and Sexual Activity   Alcohol use: No   Drug use: No   Sexual activity: Yes    Birth control/protection: Surgical  Other Topics Concern   Not on file  Social History Narrative   Not on file   Social Determinants of Health   Financial Resource Strain: Not on file  Food Insecurity: Not on file  Transportation Needs: Not on file  Physical Activity: Not on file  Stress: Not on file  Social Connections: Not on file  Intimate Partner Violence: Not on file    History reviewed. No pertinent family history.  No Known Allergies  Review of Systems  Constitutional:  Positive for malaise/fatigue.  All other systems reviewed and are negative.      Objective:   BP 120/70   Pulse 82   Ht 5\' 5"  (1.651 m)   Wt 217 lb 6.4 oz (98.6 kg)   SpO2 99%   BMI 36.18 kg/m   Vitals:    04/26/22 1332  BP: 120/70  Pulse: 82  Height: 5\' 5"  (1.651 m)  Weight: 217 lb 6.4 oz (98.6 kg)  SpO2: 99%  BMI (Calculated): 36.18    Physical Exam Vitals and nursing note reviewed.  Constitutional:      Appearance: Normal appearance. She is normal weight.  HENT:     Head: Normocephalic.  Eyes:     Extraocular Movements: Extraocular movements intact.     Conjunctiva/sclera: Conjunctivae normal.     Pupils: Pupils are equal, round, and reactive to light.  Cardiovascular:     Rate and Rhythm: Normal rate.  Pulmonary:     Effort: Pulmonary effort is normal.  Musculoskeletal:     Cervical back: Normal range of motion.  Neurological:     Mental Status: She is alert.  Psychiatric:        Mood and Affect: Mood normal.        Behavior: Behavior normal.        Thought Content: Thought content normal.      Results for orders placed or performed in visit on 04/26/22  Lipid panel  Result Value Ref Range   Cholesterol, Total 167 100 - 199 mg/dL  Triglycerides 330 (H) 0 - 149 mg/dL   HDL 27 (L) >14 mg/dL   VLDL Cholesterol Cal 54 (H) 5 - 40 mg/dL   LDL Chol Calc (NIH) 86 0 - 99 mg/dL   Chol/HDL Ratio 6.2 (H) 0.0 - 4.4 ratio  VITAMIN D 25 Hydroxy (Vit-D Deficiency, Fractures)  Result Value Ref Range   Vit D, 25-Hydroxy 26.9 (L) 30.0 - 100.0 ng/mL  CBC With Differential  Result Value Ref Range   WBC 8.9 3.4 - 10.8 x10E3/uL   RBC 4.96 3.77 - 5.28 x10E6/uL   Hemoglobin 15.7 11.1 - 15.9 g/dL   Hematocrit 78.2 (H) 95.6 - 46.6 %   MCV 96 79 - 97 fL   MCH 31.7 26.6 - 33.0 pg   MCHC 32.9 31.5 - 35.7 g/dL   RDW 21.3 08.6 - 57.8 %   Neutrophils 63 Not Estab. %   Lymphs 27 Not Estab. %   Monocytes 7 Not Estab. %   Eos 3 Not Estab. %   Basos 0 Not Estab. %   Neutrophils Absolute 5.6 1.4 - 7.0 x10E3/uL   Lymphocytes Absolute 2.4 0.7 - 3.1 x10E3/uL   Monocytes Absolute 0.6 0.1 - 0.9 x10E3/uL   EOS (ABSOLUTE) 0.3 0.0 - 0.4 x10E3/uL   Basophils Absolute 0.0 0.0 - 0.2 x10E3/uL    Immature Granulocytes 0 Not Estab. %   Immature Grans (Abs) 0.0 0.0 - 0.1 x10E3/uL  CMP14+EGFR  Result Value Ref Range   Glucose 107 (H) 70 - 99 mg/dL   BUN 13 6 - 24 mg/dL   Creatinine, Ser 4.69 0.57 - 1.00 mg/dL   eGFR 629 >52 WU/XLK/4.40   BUN/Creatinine Ratio 18 9 - 23   Sodium 140 134 - 144 mmol/L   Potassium 4.6 3.5 - 5.2 mmol/L   Chloride 106 96 - 106 mmol/L   CO2 23 20 - 29 mmol/L   Calcium 8.2 (L) 8.7 - 10.2 mg/dL   Total Protein 5.1 (L) 6.0 - 8.5 g/dL   Albumin 3.3 (L) 3.9 - 4.9 g/dL   Globulin, Total 1.8 1.5 - 4.5 g/dL   Albumin/Globulin Ratio 1.8 1.2 - 2.2   Bilirubin Total <0.2 0.0 - 1.2 mg/dL   Alkaline Phosphatase 57 44 - 121 IU/L   AST 17 0 - 40 IU/L   ALT 9 0 - 32 IU/L  TSH  Result Value Ref Range   TSH 4.460 0.450 - 4.500 uIU/mL  Hemoglobin A1c  Result Value Ref Range   Hgb A1c MFr Bld 5.6 4.8 - 5.6 %   Est. average glucose Bld gHb Est-mCnc 114 mg/dL  Vitamin N02  Result Value Ref Range   Vitamin B-12 269 232 - 1,245 pg/mL     Assessment & Plan:   Problem List Items Addressed This Visit     Autoimmune thyroiditis - Primary   Relevant Orders   CBC With Differential (Completed)   CMP14+EGFR (Completed)   TSH (Completed)   Vitamin D deficiency, unspecified   Relevant Orders   VITAMIN D 25 Hydroxy (Vit-D Deficiency, Fractures) (Completed)   CBC With Differential (Completed)   CMP14+EGFR (Completed)   Mixed hyperlipidemia   Relevant Orders   Lipid panel (Completed)   CBC With Differential (Completed)   CMP14+EGFR (Completed)   Other Visit Diagnoses     Prediabetes       Relevant Orders   CBC With Differential (Completed)   CMP14+EGFR (Completed)   Hemoglobin A1c (Completed)   B12 deficiency due to diet  Relevant Orders   CBC With Differential (Completed)   CMP14+EGFR (Completed)   Vitamin B12 (Completed)   Essential hypertension, benign       Relevant Orders   CBC With Differential (Completed)   CMP14+EGFR (Completed)      Patient's labs today-we will call with results.  Continue current dose of thyroid medication until we get lab results back-may adjust if needed. May discuss other avenues for weight loss options if appropriate based on lab work.  Return in about 3 months (around 07/26/2022) for F/U.   Total time spent: 20 minutes  Miki Kins, FNP  04/26/2022

## 2022-04-27 ENCOUNTER — Encounter: Payer: Self-pay | Admitting: Family

## 2022-04-27 DIAGNOSIS — E782 Mixed hyperlipidemia: Secondary | ICD-10-CM | POA: Insufficient documentation

## 2022-04-27 DIAGNOSIS — E559 Vitamin D deficiency, unspecified: Secondary | ICD-10-CM | POA: Insufficient documentation

## 2022-04-27 LAB — CBC WITH DIFFERENTIAL
Basophils Absolute: 0 10*3/uL (ref 0.0–0.2)
Basos: 0 %
EOS (ABSOLUTE): 0.3 10*3/uL (ref 0.0–0.4)
Eos: 3 %
Hematocrit: 47.7 % — ABNORMAL HIGH (ref 34.0–46.6)
Hemoglobin: 15.7 g/dL (ref 11.1–15.9)
Immature Grans (Abs): 0 10*3/uL (ref 0.0–0.1)
Immature Granulocytes: 0 %
Lymphocytes Absolute: 2.4 10*3/uL (ref 0.7–3.1)
Lymphs: 27 %
MCH: 31.7 pg (ref 26.6–33.0)
MCHC: 32.9 g/dL (ref 31.5–35.7)
MCV: 96 fL (ref 79–97)
Monocytes Absolute: 0.6 10*3/uL (ref 0.1–0.9)
Monocytes: 7 %
Neutrophils Absolute: 5.6 10*3/uL (ref 1.4–7.0)
Neutrophils: 63 %
RBC: 4.96 x10E6/uL (ref 3.77–5.28)
RDW: 12.5 % (ref 11.7–15.4)
WBC: 8.9 10*3/uL (ref 3.4–10.8)

## 2022-04-27 LAB — LIPID PANEL
Chol/HDL Ratio: 6.2 ratio — ABNORMAL HIGH (ref 0.0–4.4)
Cholesterol, Total: 167 mg/dL (ref 100–199)
HDL: 27 mg/dL — ABNORMAL LOW (ref >39)
LDL Chol Calc (NIH): 86 mg/dL (ref 0–99)
Triglycerides: 330 mg/dL — ABNORMAL HIGH (ref 0–149)
VLDL Cholesterol Cal: 54 mg/dL — ABNORMAL HIGH (ref 5–40)

## 2022-04-27 LAB — CMP14+EGFR
ALT: 9 IU/L (ref 0–32)
AST: 17 IU/L (ref 0–40)
Albumin/Globulin Ratio: 1.8 (ref 1.2–2.2)
Albumin: 3.3 g/dL — ABNORMAL LOW (ref 3.9–4.9)
Alkaline Phosphatase: 57 IU/L (ref 44–121)
BUN/Creatinine Ratio: 18 (ref 9–23)
BUN: 13 mg/dL (ref 6–24)
Bilirubin Total: <0.2 mg/dL (ref 0.0–1.2)
CO2: 23 mmol/L (ref 20–29)
Calcium: 8.2 mg/dL — ABNORMAL LOW (ref 8.7–10.2)
Chloride: 106 mmol/L (ref 96–106)
Creatinine, Ser: 0.74 mg/dL (ref 0.57–1.00)
Globulin, Total: 1.8 g/dL (ref 1.5–4.5)
Glucose: 107 mg/dL — ABNORMAL HIGH (ref 70–99)
Potassium: 4.6 mmol/L (ref 3.5–5.2)
Sodium: 140 mmol/L (ref 134–144)
Total Protein: 5.1 g/dL — ABNORMAL LOW (ref 6.0–8.5)
eGFR: 105 mL/min/{1.73_m2} (ref >59)

## 2022-04-27 LAB — HEMOGLOBIN A1C
Est. average glucose Bld gHb Est-mCnc: 114 mg/dL
Hgb A1c MFr Bld: 5.6 % (ref 4.8–5.6)

## 2022-04-27 LAB — VITAMIN B12: Vitamin B-12: 269 pg/mL (ref 232–1245)

## 2022-04-27 LAB — VITAMIN D 25 HYDROXY (VIT D DEFICIENCY, FRACTURES): Vit D, 25-Hydroxy: 26.9 ng/mL — ABNORMAL LOW (ref 30.0–100.0)

## 2022-04-27 LAB — TSH: TSH: 4.46 u[IU]/mL (ref 0.450–4.500)

## 2022-05-19 ENCOUNTER — Other Ambulatory Visit: Payer: Self-pay | Admitting: Family

## 2022-06-20 ENCOUNTER — Other Ambulatory Visit: Payer: Self-pay | Admitting: Family

## 2022-07-17 ENCOUNTER — Other Ambulatory Visit: Payer: Self-pay | Admitting: Family

## 2022-07-26 ENCOUNTER — Encounter: Payer: Self-pay | Admitting: Family

## 2022-07-26 ENCOUNTER — Ambulatory Visit: Payer: Medicaid Other | Admitting: Family

## 2022-07-26 VITALS — BP 114/72 | HR 73 | Ht 65.0 in | Wt 219.0 lb

## 2022-07-26 DIAGNOSIS — I10 Essential (primary) hypertension: Secondary | ICD-10-CM | POA: Diagnosis not present

## 2022-07-26 DIAGNOSIS — E559 Vitamin D deficiency, unspecified: Secondary | ICD-10-CM | POA: Diagnosis not present

## 2022-07-26 DIAGNOSIS — E782 Mixed hyperlipidemia: Secondary | ICD-10-CM | POA: Diagnosis not present

## 2022-07-26 DIAGNOSIS — E063 Autoimmune thyroiditis: Secondary | ICD-10-CM

## 2022-07-26 DIAGNOSIS — R7303 Prediabetes: Secondary | ICD-10-CM | POA: Diagnosis not present

## 2022-07-26 DIAGNOSIS — E538 Deficiency of other specified B group vitamins: Secondary | ICD-10-CM | POA: Diagnosis not present

## 2022-07-26 NOTE — Assessment & Plan Note (Signed)
Checking labs today Will send refills for meds based on results.

## 2022-07-26 NOTE — Progress Notes (Signed)
Established Patient Office Visit  Subjective:  Patient ID: Debra Blake, female    DOB: [birth year]  Age: 42 y.o. MRN: [id number]  Chief Complaint  Patient presents with   Follow-up    3 Months    Patient is here today for her 3 months follow up.  She has been feeling fairly well since last appointment.   She does have additional concerns to discuss today.  She has been having additional weight gain.  After some further discussion, patient appears to be taking in far too little, only eating two meals daily.   Labs are due today. She needs refills.   I have reviewed her active problem list, medication list, allergies, health maintenance, notes from last encounter, lab results for her appointment today.      No other concerns at this time.   Past Medical History:  Diagnosis Date   Anxiety    Autoimmune thyroiditis     Past Surgical History:  Procedure Laterality Date   TUBAL LIGATION  2015    Social History   Socioeconomic History   Marital status: Single    Spouse name: Not on file   Number of children: 3   Years of education: Not on file   Highest education level: Not on file  Occupational History   Not on file  Tobacco Use   Smoking status: Every Day    Packs/day: 1    Types: Cigarettes   Smokeless tobacco: Never  Substance and Sexual Activity   Alcohol use: No   Drug use: No   Sexual activity: Yes    Birth control/protection: Surgical  Other Topics Concern   Not on file  Social History Narrative   Not on file   Social Determinants of Health   Financial Resource Strain: Not on file  Food Insecurity: Not on file  Transportation Needs: Not on file  Physical Activity: Not on file  Stress: Not on file  Social Connections: Not on file  Intimate Partner Violence: Not on file    History reviewed. No pertinent family history.  No Known Allergies  Review of Systems  All other systems reviewed and are negative.      Objective:   BP  114/72   Pulse 73   Ht 5\' 5"  (1.651 m)   Wt 219 lb (99.3 kg)   SpO2 99%   BMI 36.44 kg/m   Vitals:   07/26/22 1410  BP: 114/72  Pulse: 73  Height: 5\' 5"  (1.651 m)  Weight: 219 lb (99.3 kg)  SpO2: 99%  BMI (Calculated): 36.44    Physical Exam Vitals and nursing note reviewed.  Constitutional:      Appearance: Normal appearance. She is obese.  HENT:     Head: Normocephalic.  Eyes:     Pupils: Pupils are equal, round, and reactive to light.  Cardiovascular:     Rate and Rhythm: Normal rate.  Pulmonary:     Effort: Pulmonary effort is normal.  Neurological:     Mental Status: She is alert.  Psychiatric:        Mood and Affect: Mood normal.        Behavior: Behavior normal.        Thought Content: Thought content normal.        Judgment: Judgment normal.      No results found for any visits on 07/26/22.  No results found for this or any previous visit (from the past 2160 hour(s)).     Assessment &  Plan:   Problem List Items Addressed This Visit       Active Problems   Autoimmune thyroiditis   Relevant Orders   CBC With Differential   CMP14+EGFR   Vitamin D deficiency, unspecified - Primary   Relevant Orders   VITAMIN D 25 Hydroxy (Vit-D Deficiency, Fractures)   CBC With Differential   CMP14+EGFR   Mixed hyperlipidemia   Relevant Orders   Lipid panel   CBC With Differential   CMP14+EGFR   Other Visit Diagnoses     B12 deficiency due to diet       Relevant Orders   CBC With Differential   CMP14+EGFR   Vitamin B12   Essential hypertension, benign       Relevant Orders   CBC With Differential   CMP14+EGFR   Prediabetes       Relevant Orders   CBC With Differential   CMP14+EGFR   Hemoglobin A1c      Patient will come in to have labs drawn fasting.  We have discussed a different plan for weight loss, trying to increase intake, given the low amount of food she was eating daily.   Will reassess at follow up.   Return in about 1 month  (around 08/26/2022).   Total time spent: 30 minutes  Miki Kins, FNP  07/26/2022   This document may have been prepared by [facility] Voice Recognition software and as such may include unintentional dictation errors.

## 2022-08-19 ENCOUNTER — Other Ambulatory Visit: Payer: Self-pay | Admitting: Family

## 2022-08-30 ENCOUNTER — Ambulatory Visit (INDEPENDENT_AMBULATORY_CARE_PROVIDER_SITE_OTHER): Payer: Medicaid Other | Admitting: Family

## 2022-08-30 ENCOUNTER — Encounter: Payer: Self-pay | Admitting: Family

## 2022-08-30 VITALS — BP 110/75 | HR 84 | Ht 65.0 in | Wt 223.4 lb

## 2022-08-30 DIAGNOSIS — Z6837 Body mass index (BMI) 37.0-37.9, adult: Secondary | ICD-10-CM

## 2022-08-30 DIAGNOSIS — E063 Autoimmune thyroiditis: Secondary | ICD-10-CM

## 2022-08-30 DIAGNOSIS — F419 Anxiety disorder, unspecified: Secondary | ICD-10-CM | POA: Diagnosis not present

## 2022-08-30 DIAGNOSIS — E559 Vitamin D deficiency, unspecified: Secondary | ICD-10-CM | POA: Diagnosis not present

## 2022-08-30 DIAGNOSIS — E782 Mixed hyperlipidemia: Secondary | ICD-10-CM | POA: Diagnosis not present

## 2022-08-30 MED ORDER — SEMAGLUTIDE-WEIGHT MANAGEMENT 1 MG/0.5ML ~~LOC~~ SOAJ: 1.0000 mg | SUBCUTANEOUS | 0 refills | Status: AC

## 2022-08-30 MED ORDER — SEMAGLUTIDE-WEIGHT MANAGEMENT 0.25 MG/0.5ML ~~LOC~~ SOAJ: 0.2500 mg | SUBCUTANEOUS | 0 refills | Status: DC

## 2022-08-30 MED ORDER — SEMAGLUTIDE-WEIGHT MANAGEMENT 2.4 MG/0.75ML ~~LOC~~ SOAJ: 2.4000 mg | SUBCUTANEOUS | 1 refills | Status: DC

## 2022-08-30 MED ORDER — SEMAGLUTIDE-WEIGHT MANAGEMENT 0.5 MG/0.5ML ~~LOC~~ SOAJ: 0.5000 mg | SUBCUTANEOUS | 0 refills | Status: DC

## 2022-08-30 MED ORDER — SEMAGLUTIDE-WEIGHT MANAGEMENT 1.7 MG/0.75ML ~~LOC~~ SOAJ: 1.7000 mg | SUBCUTANEOUS | 0 refills | Status: DC

## 2022-08-30 NOTE — Progress Notes (Unsigned)
Established Patient Office Visit  Subjective:  Patient ID: Debra Blake, female    DOB: [birth year]  Age: 42 y.o. MRN: [id number]  Chief Complaint  Patient presents with   Follow-up    1 mo f/u    Patient is here today for her 4 months follow up.  She has been feeling fairly well since last appointment.   She does have additional concerns to discuss today.  She does ask if there is anything we can do to help with her weight, says that she is still gaining.  She also needs a pap smear, thinks she is due for that.   Labs are due today. She needs refills.   I have reviewed her active problem list, medication list, allergies, notes from last encounter, lab results for her appointment today.    No other concerns at this time.   Past Medical History:  Diagnosis Date   Anxiety    Autoimmune thyroiditis     Past Surgical History:  Procedure Laterality Date   TUBAL LIGATION  2015    Social History   Socioeconomic History   Marital status: Single    Spouse name: Not on file   Number of children: 3   Years of education: Not on file   Highest education level: Not on file  Occupational History   Not on file  Tobacco Use   Smoking status: Every Day    Current packs/day: 1.00    Types: Cigarettes   Smokeless tobacco: Never  Substance and Sexual Activity   Alcohol use: No   Drug use: No   Sexual activity: Yes    Birth control/protection: Surgical  Other Topics Concern   Not on file  Social History Narrative   Not on file   Social Determinants of Health   Financial Resource Strain: Not on file  Food Insecurity: Not on file  Transportation Needs: Not on file  Physical Activity: Not on file  Stress: Not on file  Social Connections: Not on file  Intimate Partner Violence: Not on file    No family history on file.  No Known Allergies  Review of Systems  All other systems reviewed and are negative.      Objective:   BP 110/75   Pulse 84   Ht 5'  5" (1.651 m)   Wt 223 lb 6.4 oz (101.3 kg)   SpO2 99%   BMI 37.18 kg/m   Vitals:   08/30/22 1532  BP: 110/75  Pulse: 84  Height: 5\' 5"  (1.651 m)  Weight: 223 lb 6.4 oz (101.3 kg)  SpO2: 99%  BMI (Calculated): 37.18    Physical Exam Vitals and nursing note reviewed.  Constitutional:      Appearance: Normal appearance. She is obese.  HENT:     Head: Normocephalic.  Eyes:     Extraocular Movements: Extraocular movements intact.     Conjunctiva/sclera: Conjunctivae normal.     Pupils: Pupils are equal, round, and reactive to light.  Cardiovascular:     Rate and Rhythm: Normal rate.     Pulses: Normal pulses.  Pulmonary:     Effort: Pulmonary effort is normal.  Neurological:     General: No focal deficit present.     Mental Status: She is alert and oriented to person, place, and time. Mental status is at baseline.  Psychiatric:        Mood and Affect: Mood normal.        Behavior: Behavior normal.  Thought Content: Thought content normal.      No results found for any visits on 08/30/22.  No results found for this or any previous visit (from the past 2160 hour(s)).     Assessment & Plan:   Problem List Items Addressed This Visit   None   Return in about 1 month (around 09/30/2022) for F/U.   Total time spent: 30 minutes  Miki Kins, FNP  08/30/2022   This document may have been prepared by [facility] Voice Recognition software and as such may include unintentional dictation errors.

## 2022-08-31 ENCOUNTER — Encounter: Payer: Self-pay | Admitting: Family

## 2022-08-31 NOTE — Assessment & Plan Note (Signed)
Will check her labs at follow up appointment, since it's scheduled early.  Will continue supplements as needed at that time.

## 2022-08-31 NOTE — Assessment & Plan Note (Signed)
Starting patient on Wegovy.  Will adjust as needed based on results.  The patient is asked to make an attempt to improve diet and exercise patterns to aid in medical management of this problem. Addressed importance of increasing and maintaining water intake.

## 2022-08-31 NOTE — Assessment & Plan Note (Signed)
Checking labs today.  Continue current therapy for lipid control. Will modify as needed based on labwork results.  

## 2022-08-31 NOTE — Assessment & Plan Note (Signed)
Patient stable.  Well controlled with current therapy.   Continue current meds.  

## 2022-08-31 NOTE — Assessment & Plan Note (Signed)
Will check labs at follow up to see if we have any need to adjust meds.   Patient says that she is feeling well in general.

## 2022-09-03 ENCOUNTER — Telehealth: Payer: Self-pay

## 2022-09-03 ENCOUNTER — Ambulatory Visit: Payer: Medicaid Other | Admitting: Family

## 2022-09-03 VITALS — BP 112/79 | HR 73 | Temp 97.3°F | Resp 73 | Ht 65.0 in | Wt 220.2 lb

## 2022-09-03 DIAGNOSIS — R062 Wheezing: Secondary | ICD-10-CM | POA: Diagnosis not present

## 2022-09-03 DIAGNOSIS — R6889 Other general symptoms and signs: Secondary | ICD-10-CM | POA: Diagnosis not present

## 2022-09-03 DIAGNOSIS — J069 Acute upper respiratory infection, unspecified: Secondary | ICD-10-CM

## 2022-09-03 DIAGNOSIS — J208 Acute bronchitis due to other specified organisms: Secondary | ICD-10-CM | POA: Diagnosis not present

## 2022-09-03 LAB — POCT XPERT XPRESS SARS COVID-2/FLU/RSV
FLU A: NEGATIVE
FLU B: NEGATIVE
RSV RNA, PCR: NEGATIVE
SARS Coronavirus 2: NEGATIVE

## 2022-09-03 NOTE — Telephone Encounter (Signed)
Pt called office requesting to leave a message with provider stating that she is having shortness of breath and was advise to go to the ER.

## 2022-09-20 ENCOUNTER — Other Ambulatory Visit: Payer: Self-pay | Admitting: Family

## 2022-09-20 ENCOUNTER — Encounter: Payer: Self-pay | Admitting: Family

## 2022-09-20 NOTE — Progress Notes (Signed)
Acute Office Visit  Subjective:     Patient ID: Debra Blake, female    DOB: 31-Dec-1980, 42 y.o.   MRN: [id number]  Patient is in today for  Chief Complaint  Patient presents with   Acute Visit    Shortness of breath, runny nose, coughing    URI  This is a new problem. The current episode started in the past 7 days. The problem has been gradually worsening. There has been no fever. Associated symptoms include congestion, coughing, headaches, neck pain, rhinorrhea, sinus pain and a sore throat. She has tried acetaminophen, antihistamine, decongestant, increased fluids and NSAIDs for the symptoms.     Review of Systems  HENT:  Positive for congestion, rhinorrhea, sinus pain and sore throat.   Respiratory:  Positive for cough.   Musculoskeletal:  Positive for neck pain.  Neurological:  Positive for headaches.  All other systems reviewed and are negative.       Objective:    BP 112/79   Pulse 73   Temp (!) 97.3 F (36.3 C) (Tympanic)   Resp (!) 73   Ht 5\' 5"  (1.651 m)   Wt 220 lb 3.2 oz (99.9 kg)   SpO2 93%   BMI 36.64 kg/m   Physical Exam Vitals and nursing note reviewed.  Constitutional:      Appearance: Normal appearance. She is normal weight.  HENT:     Head: Normocephalic.     Nose: Congestion and rhinorrhea present.  Eyes:     Pupils: Pupils are equal, round, and reactive to light.  Cardiovascular:     Rate and Rhythm: Normal rate.  Pulmonary:     Effort: Pulmonary effort is normal.  Musculoskeletal:     Cervical back: Normal range of motion.  Neurological:     General: No focal deficit present.     Mental Status: She is alert and oriented to person, place, and time. Mental status is at baseline.  Psychiatric:        Mood and Affect: Mood normal.        Behavior: Behavior normal.        Thought Content: Thought content normal.        Judgment: Judgment normal.     Results for orders placed or performed in visit on 09/03/22  POCT XPERT  XPRESS SARS COVID-2/FLU/RSV [[id number]]  Result Value Ref Range   SARS Coronavirus 2 Negative    FLU A Negative    FLU B Negative    RSV RNA, PCR Negative     Recent Results (from the past 2160 hour(s))  POCT XPERT XPRESS SARS COVID-2/FLU/RSV [[id number]]     Status: None   Collection Time: 09/03/22 12:04 PM  Result Value Ref Range   SARS Coronavirus 2 Negative    FLU A Negative    FLU B Negative    RSV RNA, PCR Negative        Assessment & Plan:   Problem List Items Addressed This Visit   None Visit Diagnoses     Flu-like symptoms    -  Primary   Relevant Orders   POCT XPERT XPRESS SARS COVID-2/FLU/RSV [[id number]] (Completed)   Upper respiratory tract infection, unspecified type         Running COVID test in office today.  Will send meds based on results.   Return if symptoms worsen or fail to improve.  Total time spent: 20 minutes  Miki Kins, FNP  09/03/2022   This document may have  been prepared by [employer] and as such may include unintentional dictation errors.

## 2022-09-27 ENCOUNTER — Ambulatory Visit (INDEPENDENT_AMBULATORY_CARE_PROVIDER_SITE_OTHER): Payer: Medicaid Other | Admitting: Family

## 2022-09-27 ENCOUNTER — Other Ambulatory Visit: Payer: Self-pay

## 2022-09-27 ENCOUNTER — Encounter: Payer: Self-pay | Admitting: Family

## 2022-09-27 VITALS — BP 109/61 | HR 68 | Ht 65.0 in | Wt 224.4 lb

## 2022-09-27 DIAGNOSIS — J40 Bronchitis, not specified as acute or chronic: Secondary | ICD-10-CM | POA: Diagnosis not present

## 2022-09-27 DIAGNOSIS — E559 Vitamin D deficiency, unspecified: Secondary | ICD-10-CM | POA: Diagnosis not present

## 2022-09-27 DIAGNOSIS — Z1272 Encounter for screening for malignant neoplasm of vagina: Secondary | ICD-10-CM | POA: Diagnosis not present

## 2022-09-27 DIAGNOSIS — N760 Acute vaginitis: Secondary | ICD-10-CM

## 2022-09-27 DIAGNOSIS — E063 Autoimmune thyroiditis: Secondary | ICD-10-CM | POA: Diagnosis not present

## 2022-09-27 DIAGNOSIS — R7303 Prediabetes: Secondary | ICD-10-CM | POA: Diagnosis not present

## 2022-09-27 DIAGNOSIS — I10 Essential (primary) hypertension: Secondary | ICD-10-CM | POA: Diagnosis not present

## 2022-09-27 DIAGNOSIS — E782 Mixed hyperlipidemia: Secondary | ICD-10-CM

## 2022-09-27 DIAGNOSIS — Z01419 Encounter for gynecological examination (general) (routine) without abnormal findings: Secondary | ICD-10-CM | POA: Diagnosis not present

## 2022-09-27 DIAGNOSIS — E538 Deficiency of other specified B group vitamins: Secondary | ICD-10-CM | POA: Diagnosis not present

## 2022-09-27 DIAGNOSIS — Z0000 Encounter for general adult medical examination without abnormal findings: Secondary | ICD-10-CM | POA: Diagnosis not present

## 2022-09-27 DIAGNOSIS — Z6837 Body mass index (BMI) 37.0-37.9, adult: Secondary | ICD-10-CM | POA: Diagnosis not present

## 2022-09-27 MED ORDER — VENTOLIN HFA 108 (90 BASE) MCG/ACT IN AERS: 1.0000 | INHALATION_SPRAY | RESPIRATORY_TRACT | 3 refills | Status: AC | PRN

## 2022-09-27 MED ORDER — SEMAGLUTIDE-WEIGHT MANAGEMENT 0.25 MG/0.5ML ~~LOC~~ SOAJ: 0.2500 mg | SUBCUTANEOUS | 0 refills | Status: DC

## 2022-09-27 NOTE — Assessment & Plan Note (Signed)
A1C Continues to be in prediabetic ranges.  Will reassess at follow up after next lab check.  Patient counseled on dietary choices and verbalized understanding.   

## 2022-09-27 NOTE — Assessment & Plan Note (Signed)
Checking labs today.  Continue current therapy for lipid control. Will modify as needed based on labwork results.  

## 2022-09-27 NOTE — Progress Notes (Signed)
Complete physical exam  Patient: Debra Blake   DOB: 26-Oct-1980   41 y.o. Female  MRN: [id number]  Subjective:    Chief Complaint  Patient presents with   Annual Exam    CPE/Pap    Debra Blake is a 42 y.o. female who presents today for a complete physical exam. She reports consuming a general diet.  She generally feels well. She reports sleeping fairly well. She does have additional problems to discuss today.    Past Medical History:  Diagnosis Date   Anxiety    Autoimmune thyroiditis     Past Surgical History:  Procedure Laterality Date   TUBAL LIGATION  2015    History reviewed. No pertinent family history.  Social History   Socioeconomic History   Marital status: Single    Spouse name: Not on file   Number of children: 3   Years of education: Not on file   Highest education level: Not on file  Occupational History   Not on file  Tobacco Use   Smoking status: Every Day    Current packs/day: 1.00    Types: Cigarettes   Smokeless tobacco: Never  Substance and Sexual Activity   Alcohol use: No   Drug use: No   Sexual activity: Yes    Birth control/protection: Surgical  Other Topics Concern   Not on file  Social History Narrative   Not on file   Social Determinants of Health   Financial Resource Strain: Not on file  Food Insecurity: Not on file  Transportation Needs: Not on file  Physical Activity: Not on file  Stress: Not on file  Social Connections: Not on file  Intimate Partner Violence: Not on file    Outpatient Medications Prior to Visit  Medication Sig   [DISCONTINUED] VENTOLIN HFA 108 (90 Base) MCG/ACT inhaler Inhale 1-2 puffs into the lungs every 4 (four) hours as needed for wheezing or shortness of breath.   levothyroxine (SYNTHROID) 125 MCG tablet TAKE 1 TABLET BY MOUTH ONCE DAILY BEFORE BREAKFAST   [START ON 09/28/2022] Semaglutide-Weight Management 0.5 MG/0.5ML SOAJ Inject 0.5 mg into the skin once a week for 28 days.    [START ON 10/27/2022] Semaglutide-Weight Management 1 MG/0.5ML SOAJ Inject 1 mg into the skin once a week for 28 days.   [START ON 11/25/2022] Semaglutide-Weight Management 1.7 MG/0.75ML SOAJ Inject 1.7 mg into the skin once a week for 28 days.   [START ON 12/24/2022] Semaglutide-Weight Management 2.4 MG/0.75ML SOAJ Inject 2.4 mg into the skin once a week.   [DISCONTINUED] Semaglutide-Weight Management 0.25 MG/0.5ML SOAJ Inject 0.25 mg into the skin once a week for 28 days.   No facility-administered medications prior to visit.    Review of Systems  Respiratory:  Positive for cough, shortness of breath and wheezing.   Skin:        "Bump" on my backside, thinks it might be a spider bite?   All other systems reviewed and are negative.       Objective:     BP 109/61   Pulse 68   Ht 5\' 5"  (1.651 m)   Wt 224 lb 6.4 oz (101.8 kg)   LMP 09/13/2022   SpO2 99%   BMI 37.34 kg/m    Physical Exam Vitals and nursing note reviewed.  Constitutional:      Appearance: Normal appearance. She is normal weight.  HENT:     Head: Normocephalic.  Eyes:     Extraocular Movements: Extraocular movements intact.  Conjunctiva/sclera: Conjunctivae normal.     Pupils: Pupils are equal, round, and reactive to light.  Cardiovascular:     Rate and Rhythm: Normal rate.  Pulmonary:     Effort: Pulmonary effort is normal.  Neurological:     General: No focal deficit present.     Mental Status: She is alert and oriented to person, place, and time. Mental status is at baseline.  Psychiatric:        Mood and Affect: Mood normal.        Behavior: Behavior normal.        Thought Content: Thought content normal.        Judgment: Judgment normal.      No results found for any visits on 09/27/22.  Recent Results (from the past 2160 hour(s))  POCT XPERT XPRESS SARS COVID-2/FLU/RSV [[id number]]     Status: None   Collection Time: 09/03/22 12:04 PM  Result Value Ref Range   SARS Coronavirus 2 Negative     FLU A Negative    FLU B Negative    RSV RNA, PCR Negative         Assessment & Plan:    Routine Health Maintenance and Physical Exam   There is no immunization history on file for this patient.  Health Maintenance  Topic Date Due   HIV Screening  Never done   Hepatitis C Screening  Never done   DTaP/Tdap/Td (1 - Tdap) Never done   PAP SMEAR-Modifier  Never done   COVID-19 Vaccine (1 - 2023-24 season) Never done   INFLUENZA VACCINE  11/18/2022 (Originally 08/19/2022)   HPV VACCINES  Aged Out    Discussed health benefits of physical activity, and encouraged her to engage in regular exercise appropriate for her age and condition.  Problem List Items Addressed This Visit       Active Problems   Autoimmune thyroiditis    Patient stable.  Well controlled with current therapy.   Continue current meds.       Relevant Orders   CMP14+EGFR   CBC with Differential/Platelet   TSH+T4F+T3Free   Vitamin D deficiency, unspecified    A1C Continues to be in prediabetic ranges.  Will reassess at follow up after next lab check.  Patient counseled on dietary choices and verbalized understanding.       Relevant Orders   VITAMIN D 25 Hydroxy (Vit-D Deficiency, Fractures)   CMP14+EGFR   CBC with Differential/Platelet   Mixed hyperlipidemia    Checking labs today.  Continue current therapy for lipid control. Will modify as needed based on labwork results.        Relevant Orders   Lipid panel   CMP14+EGFR   CBC with Differential/Platelet   Class 2 severe obesity due to excess calories with serious comorbidity and body mass index (BMI) of 37.0 to 37.9 in adult [facility])    Continue current meds.  Will adjust as needed based on results.  The patient is asked to make an attempt to improve diet and exercise patterns to aid in medical management of this problem. Addressed importance of increasing and maintaining water intake.        Relevant Medications   Semaglutide-Weight  Management 0.25 MG/0.5ML SOAJ   Prediabetes    Patient educated on foods that contain carbohydrates and the need to decrease intake.  We discussed prediabetes, and what it means and the need for strict dietary control to prevent progression to type 2 diabetes.  Advised to decrease intake of sugary  drinks, including sodas, sweet tea, and some juices, and of starch and sugar heavy foods (ie., potatoes, rice, bread, pasta, desserts). She verbalizes understanding and agreement with the changes discussed today.  A1C Continues to be in prediabetic ranges.  Will reassess at follow up after next lab check.  Patient counseled on dietary choices and verbalized understanding.       Relevant Orders   CMP14+EGFR   Hemoglobin A1c   CBC with Differential/Platelet   Other Visit Diagnoses     Routine general medical examination at a health care facility    -  Primary   B12 deficiency due to diet       Checking labs today.  Will continue supplements as needed.   Relevant Orders   CMP14+EGFR   Vitamin B12   CBC with Differential/Platelet   Bronchitis       sending inhalers for her to use.  Will also send refill of her albuterol inhaler.   Relevant Orders   CMP14+EGFR   CBC with Differential/Platelet   Vaginal Pap smear       Relevant Orders   IGP,Aptima HPV Age Gdln,CtNgTv   Acute vaginitis       Relevant Orders   IGP,Aptima HPV Age Gdln,CtNgTv   Visit for gynecologic examination          Return in about 3 months (around 12/27/2022).     Miki Kins, FNP  09/27/2022   This document may have been prepared by [facility] Voice Recognition software and as such may include unintentional dictation errors.

## 2022-09-27 NOTE — Assessment & Plan Note (Signed)
Patient stable.  Well controlled with current therapy.   Continue current meds.  

## 2022-09-27 NOTE — Assessment & Plan Note (Signed)

## 2022-09-27 NOTE — Assessment & Plan Note (Signed)
Continue current meds.  Will adjust as needed based on results.  The patient is asked to make an attempt to improve diet and exercise patterns to aid in medical management of this problem. Addressed importance of increasing and maintaining water intake.   

## 2022-09-28 ENCOUNTER — Other Ambulatory Visit: Payer: Self-pay

## 2022-09-28 LAB — VITAMIN B12: Vitamin B-12: 310 pg/mL (ref 232–1245)

## 2022-09-28 LAB — CMP14+EGFR
ALT: 11 IU/L (ref 0–32)
AST: 14 IU/L (ref 0–40)
Albumin: 3.5 g/dL — ABNORMAL LOW (ref 3.9–4.9)
Alkaline Phosphatase: 53 IU/L (ref 44–121)
BUN/Creatinine Ratio: 18 (ref 9–23)
BUN: 15 mg/dL (ref 6–24)
Bilirubin Total: <0.2 mg/dL (ref 0.0–1.2)
CO2: 23 mmol/L (ref 20–29)
Calcium: 8.8 mg/dL (ref 8.7–10.2)
Chloride: 104 mmol/L (ref 96–106)
Creatinine, Ser: 0.82 mg/dL (ref 0.57–1.00)
Globulin, Total: 2.2 g/dL (ref 1.5–4.5)
Glucose: 70 mg/dL (ref 70–99)
Potassium: 4.5 mmol/L (ref 3.5–5.2)
Sodium: 139 mmol/L (ref 134–144)
Total Protein: 5.7 g/dL — ABNORMAL LOW (ref 6.0–8.5)
eGFR: 93 mL/min/{1.73_m2} (ref >59)

## 2022-09-28 LAB — LIPID PANEL
Chol/HDL Ratio: 6.2 ratio — ABNORMAL HIGH (ref 0.0–4.4)
Cholesterol, Total: 206 mg/dL — ABNORMAL HIGH (ref 100–199)
HDL: 33 mg/dL — ABNORMAL LOW (ref >39)
LDL Chol Calc (NIH): 131 mg/dL — ABNORMAL HIGH (ref 0–99)
Triglycerides: 237 mg/dL — ABNORMAL HIGH (ref 0–149)
VLDL Cholesterol Cal: 42 mg/dL — ABNORMAL HIGH (ref 5–40)

## 2022-09-28 LAB — VITAMIN D 25 HYDROXY (VIT D DEFICIENCY, FRACTURES): Vit D, 25-Hydroxy: 31.3 ng/mL (ref 30.0–100.0)

## 2022-09-28 LAB — TSH+T4F+T3FREE
Free T4: 1.13 ng/dL (ref 0.82–1.77)
T3, Free: 2.6 pg/mL (ref 2.0–4.4)
TSH: 15.2 u[IU]/mL — ABNORMAL HIGH (ref 0.450–4.500)

## 2022-09-28 LAB — HEMOGLOBIN A1C
Est. average glucose Bld gHb Est-mCnc: 111 mg/dL
Hgb A1c MFr Bld: 5.5 % (ref 4.8–5.6)

## 2022-09-28 MED ORDER — LEVOTHYROXINE SODIUM 137 MCG PO TABS: 137.0000 ug | ORAL_TABLET | Freq: Every day | ORAL | 1 refills | Status: DC

## 2022-10-02 LAB — IGP,APTIMA HPV AGE GDLN,CTNGTV

## 2022-10-02 LAB — IGP,CTNGTV,APT HPV,RFX16/18,45
Chlamydia, Nuc. Acid Amp: NEGATIVE
Gonococcus, Nuc. Acid Amp: NEGATIVE
HPV Aptima: NEGATIVE
PAP Smear Comment: 0
Trich vag by NAA: NEGATIVE

## 2022-10-12 ENCOUNTER — Telehealth: Payer: Self-pay | Admitting: Family

## 2022-10-12 NOTE — Telephone Encounter (Signed)
Patient left VM that since she is on the weight loss shot she doesn't have much of an appetite. Wants to know what kind of Ensure drinks she should buy and drink? Please advise.

## 2022-10-12 NOTE — Telephone Encounter (Signed)
She's already sent a mychart message as well once [name] gets back with me I will let her know what to do

## 2022-10-18 ENCOUNTER — Other Ambulatory Visit: Payer: Self-pay | Admitting: Family

## 2022-10-22 ENCOUNTER — Other Ambulatory Visit: Payer: Self-pay

## 2022-10-22 MED ORDER — SEMAGLUTIDE-WEIGHT MANAGEMENT 0.5 MG/0.5ML ~~LOC~~ SOAJ
0.5000 mg | SUBCUTANEOUS | 0 refills | Status: AC
  Filled 2022-10-22: qty 2, 28d supply, fill #0

## 2022-10-25 ENCOUNTER — Other Ambulatory Visit: Payer: Self-pay

## 2022-11-15 ENCOUNTER — Other Ambulatory Visit: Payer: Self-pay | Admitting: Family

## 2022-11-16 ENCOUNTER — Other Ambulatory Visit: Payer: Self-pay

## 2022-11-16 ENCOUNTER — Other Ambulatory Visit: Payer: Self-pay | Admitting: Family

## 2022-11-18 ENCOUNTER — Other Ambulatory Visit: Payer: Self-pay

## 2022-12-08 ENCOUNTER — Other Ambulatory Visit: Payer: Self-pay | Admitting: Family

## 2022-12-27 ENCOUNTER — Encounter: Payer: Self-pay | Admitting: Family

## 2022-12-27 ENCOUNTER — Ambulatory Visit: Payer: Medicaid Other | Admitting: Family

## 2022-12-27 VITALS — BP 92/60 | HR 82 | Ht 65.0 in | Wt 201.0 lb

## 2022-12-27 DIAGNOSIS — N760 Acute vaginitis: Secondary | ICD-10-CM | POA: Diagnosis not present

## 2022-12-27 DIAGNOSIS — E538 Deficiency of other specified B group vitamins: Secondary | ICD-10-CM | POA: Diagnosis not present

## 2022-12-27 DIAGNOSIS — R7303 Prediabetes: Secondary | ICD-10-CM | POA: Diagnosis not present

## 2022-12-27 DIAGNOSIS — E66812 Obesity, class 2: Secondary | ICD-10-CM | POA: Diagnosis not present

## 2022-12-27 DIAGNOSIS — Z6837 Body mass index (BMI) 37.0-37.9, adult: Secondary | ICD-10-CM

## 2022-12-27 DIAGNOSIS — E063 Autoimmune thyroiditis: Secondary | ICD-10-CM | POA: Diagnosis not present

## 2022-12-27 DIAGNOSIS — Z3200 Encounter for pregnancy test, result unknown: Secondary | ICD-10-CM | POA: Diagnosis not present

## 2022-12-27 DIAGNOSIS — E559 Vitamin D deficiency, unspecified: Secondary | ICD-10-CM | POA: Diagnosis not present

## 2022-12-27 DIAGNOSIS — E782 Mixed hyperlipidemia: Secondary | ICD-10-CM | POA: Diagnosis not present

## 2022-12-27 LAB — POCT URINALYSIS DIPSTICK
Glucose, UA: NEGATIVE
Ketones, UA: NEGATIVE
Leukocytes, UA: NEGATIVE
Nitrite, UA: NEGATIVE
Protein, UA: POSITIVE — AB
Spec Grav, UA: >=1.03 — AB (ref 1.010–1.025)
Urobilinogen, UA: 1 U/dL
pH, UA: 6 (ref 5.0–8.0)

## 2022-12-27 LAB — POCT URINE PREGNANCY: Preg Test, Ur: NEGATIVE

## 2022-12-27 NOTE — Progress Notes (Unsigned)
Established Patient Office Visit  Subjective:  Patient ID: Debra Blake, female    DOB: 05/03/1980  Age: 42 y.o. MRN: [id number]  Chief Complaint  Patient presents with   Follow-up    3 mo    Patient is here today for her 3 months follow up.  She has been feeling fairly well since last appointment.   She does have additional concerns to discuss today.  Has been having some vaginal odor that she says is concerning her.  She also has not had a period this month, so she is worried that there is a possibility she might be pregnant.   Labs are due today. She needs refills.   I have reviewed her active problem list, medication list, allergies, health maintenance, notes from last encounter, lab results for her appointment today.      No other concerns at this time.   Past Medical History:  Diagnosis Date   Anxiety    Autoimmune thyroiditis     Past Surgical History:  Procedure Laterality Date   TUBAL LIGATION  2015    Social History   Socioeconomic History   Marital status: Single    Spouse name: Not on file   Number of children: 3   Years of education: Not on file   Highest education level: Not on file  Occupational History   Not on file  Tobacco Use   Smoking status: Every Day    Current packs/day: 1.00    Types: Cigarettes   Smokeless tobacco: Never  Substance and Sexual Activity   Alcohol use: No   Drug use: No   Sexual activity: Yes    Birth control/protection: Surgical  Other Topics Concern   Not on file  Social History Narrative   Not on file   Social Determinants of Health   Financial Resource Strain: Not on file  Food Insecurity: Not on file  Transportation Needs: Not on file  Physical Activity: Not on file  Stress: Not on file  Social Connections: Not on file  Intimate Partner Violence: Not on file    History reviewed. No pertinent family history.  No Known Allergies  Review of Systems  All other systems reviewed and are  negative.      Objective:   BP 92/60   Pulse 82   Ht 5\' 5"  (1.651 m)   Wt 201 lb (91.2 kg)   SpO2 97%   BMI 33.45 kg/m   Vitals:   12/27/22 1118  BP: 92/60  Pulse: 82  Height: 5\' 5"  (1.651 m)  Weight: 201 lb (91.2 kg)  SpO2: 97%  BMI (Calculated): 33.45    Physical Exam Vitals and nursing note reviewed.  Constitutional:      Appearance: Normal appearance. She is normal weight.  HENT:     Head: Normocephalic.  Eyes:     Extraocular Movements: Extraocular movements intact.     Conjunctiva/sclera: Conjunctivae normal.     Pupils: Pupils are equal, round, and reactive to light.  Cardiovascular:     Rate and Rhythm: Normal rate.  Pulmonary:     Effort: Pulmonary effort is normal.  Neurological:     General: No focal deficit present.     Mental Status: She is alert and oriented to person, place, and time. Mental status is at baseline.  Psychiatric:        Mood and Affect: Mood normal.        Behavior: Behavior normal.  Thought Content: Thought content normal.        Judgment: Judgment normal.      Results for orders placed or performed in visit on 12/27/22  POCT Urinalysis Dipstick (81002)  Result Value Ref Range   Color, UA orange    Clarity, UA turbid    Glucose, UA Negative Negative   Bilirubin, UA small    Ketones, UA negative    Spec Grav, UA >=1.030 (A) 1.010 - 1.025   Blood, UA large    pH, UA 6.0 5.0 - 8.0   Protein, UA Positive (A) Negative   Urobilinogen, UA 1.0 0.2 or 1.0 E.U./dL   Nitrite, UA negative    Leukocytes, UA Negative Negative   Appearance     Odor    POCT Urine Pregnancy  Result Value Ref Range   Preg Test, Ur Negative Negative    Recent Results (from the past 2160 hour(s))  POCT Urinalysis Dipstick (40981)     Status: Abnormal   Collection Time: 12/27/22 12:02 PM  Result Value Ref Range   Color, UA orange    Clarity, UA turbid    Glucose, UA Negative Negative   Bilirubin, UA small    Ketones, UA negative    Spec  Grav, UA >=1.030 (A) 1.010 - 1.025   Blood, UA large    pH, UA 6.0 5.0 - 8.0   Protein, UA Positive (A) Negative   Urobilinogen, UA 1.0 0.2 or 1.0 E.U./dL   Nitrite, UA negative    Leukocytes, UA Negative Negative   Appearance     Odor    POCT Urine Pregnancy     Status: Normal   Collection Time: 12/27/22 12:06 PM  Result Value Ref Range   Preg Test, Ur Negative Negative       Assessment & Plan:   Problem List Items Addressed This Visit       Active Problems   Autoimmune thyroiditis    Patient stable.  Well controlled with current therapy.   Continue current meds.       Relevant Orders   CMP14+EGFR   CBC with Diff   TSH+T4F+T3Free   Vitamin D deficiency, unspecified - Primary    A1C Continues to be in prediabetic ranges.  Will reassess at follow up after next lab check.  Patient counseled on dietary choices and verbalized understanding.       Relevant Orders   VITAMIN D 25 Hydroxy (Vit-D Deficiency, Fractures)   CMP14+EGFR   CBC with Diff   Mixed hyperlipidemia    Checking labs today.  Continue current therapy for lipid control. Will modify as needed based on labwork results.        Relevant Orders   Lipid panel   CMP14+EGFR   CBC with Diff   Class 2 severe obesity due to excess calories with serious comorbidity and body mass index (BMI) of 37.0 to 37.9 in adult [facility])    Continue current meds.  Will adjust as needed based on results.  The patient is asked to make an attempt to improve diet and exercise patterns to aid in medical management of this problem. Addressed importance of increasing and maintaining water intake.        Relevant Medications   Semaglutide-Weight Management (WEGOVY) 1.7 MG/0.75ML SOAJ   Other Relevant Orders   CMP14+EGFR   CBC with Diff   Prediabetes    Patient educated on foods that contain carbohydrates and the need to decrease intake.  We discussed prediabetes, and what  it means and the need for strict dietary control to  prevent progression to type 2 diabetes.  Advised to decrease intake of sugary drinks, including sodas, sweet tea, and some juices, and of starch and sugar heavy foods (ie., potatoes, rice, bread, pasta, desserts). She verbalizes understanding and agreement with the changes discussed today.  A1C Continues to be in prediabetic ranges.  Will reassess at follow up after next lab check.  Patient counseled on dietary choices and verbalized understanding.       Relevant Orders   CMP14+EGFR   Hemoglobin A1c   CBC with Diff   POCT Urinalysis Dipstick (81002) (Completed)   Other Visit Diagnoses     B12 deficiency due to diet       Checking labs today.  Will continue supplements as needed.   Relevant Orders   CMP14+EGFR   Vitamin B12   CBC with Diff   Possible pregnancy, not yet confirmed       UPT in office today negative. Patient notified   Relevant Orders   POCT Urine Pregnancy (Completed)   Acute vaginitis       Sending swab to [facility] for patient. Will call with results when available   Relevant Orders   NuSwab VG+, HSV       Return in about 4 months (around 04/27/2023).   Total time spent: 20 minutes  Miki Kins, FNP  12/27/2022   This document may have been prepared by [facility] Voice Recognition software and as such may include unintentional dictation errors.

## 2022-12-28 ENCOUNTER — Encounter: Payer: Self-pay | Admitting: Family

## 2022-12-28 NOTE — Assessment & Plan Note (Signed)

## 2022-12-28 NOTE — Assessment & Plan Note (Signed)
Checking labs today.  Continue current therapy for lipid control. Will modify as needed based on labwork results.  

## 2022-12-28 NOTE — Assessment & Plan Note (Signed)
A1C Continues to be in prediabetic ranges.  Will reassess at follow up after next lab check.  Patient counseled on dietary choices and verbalized understanding.   

## 2022-12-28 NOTE — Assessment & Plan Note (Signed)
Continue current meds.  Will adjust as needed based on results.  The patient is asked to make an attempt to improve diet and exercise patterns to aid in medical management of this problem. Addressed importance of increasing and maintaining water intake.   

## 2022-12-28 NOTE — Assessment & Plan Note (Signed)
Patient stable.  Well controlled with current therapy.   Continue current meds.  

## 2022-12-30 LAB — NUSWAB VG+, HSV
BVAB 2: HIGH {score} — AB
Candida albicans, NAA: NEGATIVE
Candida glabrata, NAA: NEGATIVE
Chlamydia trachomatis, NAA: NEGATIVE
HSV 1 NAA: NEGATIVE
HSV 2 NAA: NEGATIVE
Megasphaera 1: HIGH {score} — AB
Neisseria gonorrhoeae, NAA: POSITIVE — AB
Trich vag by NAA: NEGATIVE

## 2022-12-31 ENCOUNTER — Encounter: Payer: Self-pay | Admitting: Family

## 2022-12-31 MED ORDER — METRONIDAZOLE 500 MG PO TABS: 500.0000 mg | ORAL_TABLET | Freq: Two times a day (BID) | ORAL | 0 refills | Status: AC

## 2022-12-31 MED ORDER — DOXYCYCLINE HYCLATE 100 MG PO CAPS: 100.0000 mg | ORAL_CAPSULE | Freq: Two times a day (BID) | ORAL | 0 refills | Status: DC

## 2022-12-31 NOTE — Addendum Note (Signed)
Addended by: Grayling Congress on: 12/31/2022 04:06 PM   Modules accepted: Orders

## 2023-01-03 ENCOUNTER — Other Ambulatory Visit: Payer: Medicaid Other

## 2023-01-03 DIAGNOSIS — E782 Mixed hyperlipidemia: Secondary | ICD-10-CM | POA: Diagnosis not present

## 2023-01-03 DIAGNOSIS — E66812 Obesity, class 2: Secondary | ICD-10-CM | POA: Diagnosis not present

## 2023-01-03 DIAGNOSIS — E559 Vitamin D deficiency, unspecified: Secondary | ICD-10-CM | POA: Diagnosis not present

## 2023-01-03 DIAGNOSIS — E063 Autoimmune thyroiditis: Secondary | ICD-10-CM | POA: Diagnosis not present

## 2023-01-03 DIAGNOSIS — R7303 Prediabetes: Secondary | ICD-10-CM | POA: Diagnosis not present

## 2023-01-03 DIAGNOSIS — E538 Deficiency of other specified B group vitamins: Secondary | ICD-10-CM | POA: Diagnosis not present

## 2023-01-03 DIAGNOSIS — Z6837 Body mass index (BMI) 37.0-37.9, adult: Secondary | ICD-10-CM | POA: Diagnosis not present

## 2023-01-04 LAB — CMP14+EGFR
ALT: 10 [IU]/L (ref 0–32)
AST: 15 [IU]/L (ref 0–40)
Albumin: 3.9 g/dL (ref 3.9–4.9)
Alkaline Phosphatase: 43 [IU]/L — ABNORMAL LOW (ref 44–121)
BUN/Creatinine Ratio: 22 (ref 9–23)
BUN: 19 mg/dL (ref 6–24)
Bilirubin Total: 0.2 mg/dL (ref 0.0–1.2)
CO2: 23 mmol/L (ref 20–29)
Calcium: 9.1 mg/dL (ref 8.7–10.2)
Chloride: 105 mmol/L (ref 96–106)
Creatinine, Ser: 0.85 mg/dL (ref 0.57–1.00)
Globulin, Total: 2 g/dL (ref 1.5–4.5)
Glucose: 72 mg/dL (ref 70–99)
Potassium: 4.2 mmol/L (ref 3.5–5.2)
Sodium: 141 mmol/L (ref 134–144)
Total Protein: 5.9 g/dL — ABNORMAL LOW (ref 6.0–8.5)
eGFR: 88 mL/min/{1.73_m2} (ref >59)

## 2023-01-04 LAB — CBC WITH DIFFERENTIAL/PLATELET
Basophils Absolute: 0 10*3/uL (ref 0.0–0.2)
Basos: 0 %
EOS (ABSOLUTE): 0.1 10*3/uL (ref 0.0–0.4)
Eos: 2 %
Hematocrit: 47.7 % — ABNORMAL HIGH (ref 34.0–46.6)
Hemoglobin: 15.4 g/dL (ref 11.1–15.9)
Immature Grans (Abs): 0 10*3/uL (ref 0.0–0.1)
Immature Granulocytes: 0 %
Lymphocytes Absolute: 1.9 10*3/uL (ref 0.7–3.1)
Lymphs: 24 %
MCH: 31.4 pg (ref 26.6–33.0)
MCHC: 32.3 g/dL (ref 31.5–35.7)
MCV: 97 fL (ref 79–97)
Monocytes Absolute: 0.4 10*3/uL (ref 0.1–0.9)
Monocytes: 5 %
Neutrophils Absolute: 5.2 10*3/uL (ref 1.4–7.0)
Neutrophils: 69 %
Platelets: 218 10*3/uL (ref 150–450)
RBC: 4.9 x10E6/uL (ref 3.77–5.28)
RDW: 12.4 % (ref 11.7–15.4)
WBC: 7.6 10*3/uL (ref 3.4–10.8)

## 2023-01-04 LAB — TSH+T4F+T3FREE
Free T4: 1.27 ng/dL (ref 0.82–1.77)
T3, Free: 2.1 pg/mL (ref 2.0–4.4)
TSH: 3.48 u[IU]/mL (ref 0.450–4.500)

## 2023-01-04 LAB — VITAMIN B12: Vitamin B-12: 320 pg/mL (ref 232–1245)

## 2023-01-04 LAB — VITAMIN D 25 HYDROXY (VIT D DEFICIENCY, FRACTURES): Vit D, 25-Hydroxy: 33 ng/mL (ref 30.0–100.0)

## 2023-01-04 LAB — LIPID PANEL
Chol/HDL Ratio: 5.2 {ratio} — ABNORMAL HIGH (ref 0.0–4.4)
Cholesterol, Total: 186 mg/dL (ref 100–199)
HDL: 36 mg/dL — ABNORMAL LOW (ref >39)
LDL Chol Calc (NIH): 122 mg/dL — ABNORMAL HIGH (ref 0–99)
Triglycerides: 154 mg/dL — ABNORMAL HIGH (ref 0–149)
VLDL Cholesterol Cal: 28 mg/dL (ref 5–40)

## 2023-01-04 LAB — HEMOGLOBIN A1C
Est. average glucose Bld gHb Est-mCnc: 105 mg/dL
Hgb A1c MFr Bld: 5.3 % (ref 4.8–5.6)

## 2023-01-11 ENCOUNTER — Other Ambulatory Visit: Payer: Self-pay | Admitting: Family

## 2023-01-14 ENCOUNTER — Telehealth: Payer: Self-pay

## 2023-01-14 NOTE — Telephone Encounter (Signed)
Health dept called and stated that per the CDC guide like Doxycycline is not the recommended rx to prescribe for gonorrhea, the state will not accept that as a proper cure for the STD   Rocephin is what they are requesting to be sent in-

## 2023-01-25 ENCOUNTER — Other Ambulatory Visit: Payer: Self-pay | Admitting: Family

## 2023-01-25 MED ORDER — CEFIXIME 400 MG PO CAPS: 800.0000 mg | ORAL_CAPSULE | Freq: Once | ORAL | 0 refills | Status: AC

## 2023-01-27 ENCOUNTER — Telehealth: Payer: Self-pay | Admitting: Family

## 2023-01-27 NOTE — Telephone Encounter (Signed)
 Patient left VM that the pharmacy has medication ready for her but she isn't sure what the medication is for. Did not state the med name.

## 2023-02-21 ENCOUNTER — Ambulatory Visit: Payer: Medicaid Other | Admitting: Family

## 2023-02-21 DIAGNOSIS — N760 Acute vaginitis: Secondary | ICD-10-CM | POA: Diagnosis not present

## 2023-02-24 LAB — NUSWAB VAGINITIS PLUS (VG+)
Candida albicans, NAA: NEGATIVE
Candida glabrata, NAA: NEGATIVE
Chlamydia trachomatis, NAA: NEGATIVE
Neisseria gonorrhoeae, NAA: NEGATIVE
Trich vag by NAA: NEGATIVE

## 2023-02-27 ENCOUNTER — Other Ambulatory Visit: Payer: Self-pay | Admitting: Family

## 2023-03-23 ENCOUNTER — Other Ambulatory Visit: Payer: Self-pay | Admitting: Family

## 2023-04-01 ENCOUNTER — Telehealth: Payer: Self-pay | Admitting: Family

## 2023-04-01 NOTE — Telephone Encounter (Signed)
 PT states she has no more WGOVY yet & that the pharm told her they were waiting on a PA  Please advise

## 2023-04-11 ENCOUNTER — Encounter: Payer: Self-pay | Admitting: Family

## 2023-04-11 ENCOUNTER — Ambulatory Visit: Admitting: Family

## 2023-04-11 VITALS — BP 100/70 | Ht 65.0 in | Wt 178.6 lb

## 2023-04-11 DIAGNOSIS — Z6837 Body mass index (BMI) 37.0-37.9, adult: Secondary | ICD-10-CM

## 2023-04-11 DIAGNOSIS — E66812 Obesity, class 2: Secondary | ICD-10-CM

## 2023-04-11 DIAGNOSIS — Z0130 Encounter for examination of blood pressure without abnormal findings: Secondary | ICD-10-CM

## 2023-04-11 NOTE — Progress Notes (Signed)
 Established Patient Office Visit  Subjective:  Patient ID: Debra Blake, female    DOB: 04/01/1980  Age: 43 y.o. MRN: [id number]  Chief Complaint  Patient presents with   Follow-up    Weight check for [facility]    Patient is here today for her  follow up.  She has been feeling well since last appointment.   She does have additional concerns to discuss today.  She needs her wegovy prior authorization re-authorized.  Labs are not due today. She needs refills.   I have reviewed her active problem list, medication list, allergies, health maintenance, notes from last encounter, lab results for her appointment today.    No other concerns at this time.   Past Medical History:  Diagnosis Date   Anxiety    Autoimmune thyroiditis     Past Surgical History:  Procedure Laterality Date   TUBAL LIGATION  2015    Social History   Socioeconomic History   Marital status: Single    Spouse name: Not on file   Number of children: 3   Years of education: Not on file   Highest education level: Not on file  Occupational History   Not on file  Tobacco Use   Smoking status: Every Day    Current packs/day: 1.00    Types: Cigarettes   Smokeless tobacco: Never  Substance and Sexual Activity   Alcohol use: No   Drug use: No   Sexual activity: Yes    Birth control/protection: Surgical  Other Topics Concern   Not on file  Social History Narrative   Not on file   Social Drivers of Health   Financial Resource Strain: Not on file  Food Insecurity: Not on file  Transportation Needs: Not on file  Physical Activity: Not on file  Stress: Not on file  Social Connections: Not on file  Intimate Partner Violence: Not on file    History reviewed. No pertinent family history.  No Known Allergies  Review of Systems  All other systems reviewed and are negative.      Objective:   BP 100/70   Ht 5\' 5"  (1.651 m)   Wt 178 lb 9.6 oz (81 kg)   BMI 29.72 kg/m   Vitals:    04/11/23 1129  BP: 100/70  Height: 5\' 5"  (1.651 m)  Weight: 178 lb 9.6 oz (81 kg)  BMI (Calculated): 29.72   Wt Readings from Last 3 Encounters:   BMI readings:   04/11/23 178 lb 9.6 oz (81 kg) 37.18 kg/m2  12/27/22 201 lb (91.2 kg) 33.45 kg/m2  09/27/22 224 lb 6.4 oz (101.8 kg) 37.34 kg /m2    Physical Exam   No results found for any visits on 04/11/23.  Recent Results (from the past 2160 hours)  NuSwab Vaginitis Plus (VG+)     Status: None   Collection Time: 02/21/23  3:00 PM  Result Value Ref Range   Atopobium vaginae Low - 0 Score   BVAB 2 Low - 0 Score   Megasphaera 1 Low - 0 Score    Comment: Calculate total score by adding the 3 individual bacterial vaginosis (BV) marker scores together.  Total score is interpreted as follows: Total score 0-1: Indicates the absence of BV. Total score   2: Indeterminate for BV. Additional clinical                  data should be evaluated to establish a  diagnosis. Total score 3-6: Indicates the presence of BV.    Candida albicans, NAA Negative Negative   Candida glabrata, NAA Negative Negative   Trich vag by NAA Negative Negative   Chlamydia trachomatis, NAA Negative Negative   Neisseria gonorrhoeae, NAA Negative Negative       Assessment & Plan:   Problem List Items Addressed This Visit       Other   Class 2 severe obesity due to excess calories with serious comorbidity and body mass index (BMI) of 37.0 to 37.9 in adult (HCC) - Primary   Starting weight: 223lb, 6.4oz.  Starting BMI: 37.34  Patient has lost significant weight and will benefit from continuing her RX.         Return in about 3 weeks (around 05/02/2023).   Total time spent: 20 minutes  Miki Kins, FNP  04/11/2023   This document may have been prepared by [facility] Voice Recognition software and as such may include unintentional dictation errors.

## 2023-04-15 ENCOUNTER — Telehealth: Payer: Self-pay | Admitting: Family

## 2023-04-15 NOTE — Telephone Encounter (Signed)
 Patient left VM wanting to know if we have gotten her Reginal Lutes approved yet.  I have not had a chance to submit an appeal yet. Insurance had denied it so we will have to appeal that decision.

## 2023-04-18 NOTE — Telephone Encounter (Signed)
 Patient left another VM to check status of her [facility] approval. I am working on the appeal for this now.

## 2023-04-21 NOTE — Telephone Encounter (Signed)
 Patient left another VM to see if her Debra Blake has gotten approved. I have already let her know that it was denied and [name] has to edit her last note and state her baseline weight and BMI.

## 2023-04-29 ENCOUNTER — Encounter: Payer: Self-pay | Admitting: Family

## 2023-04-29 NOTE — Assessment & Plan Note (Signed)
 Starting weight: 223lb, 6.4oz.  Starting BMI: 37.34  Patient has lost significant weight and will benefit from continuing her RX.

## 2023-05-02 ENCOUNTER — Encounter: Payer: Self-pay | Admitting: Family

## 2023-05-02 ENCOUNTER — Ambulatory Visit: Payer: Medicaid Other | Admitting: Family

## 2023-05-02 ENCOUNTER — Ambulatory Visit: Admitting: Family

## 2023-05-02 VITALS — BP 114/70 | Ht 65.0 in | Wt 182.2 lb

## 2023-05-02 DIAGNOSIS — E66812 Obesity, class 2: Secondary | ICD-10-CM

## 2023-05-02 DIAGNOSIS — Z6837 Body mass index (BMI) 37.0-37.9, adult: Secondary | ICD-10-CM | POA: Diagnosis not present

## 2023-05-02 DIAGNOSIS — Z0130 Encounter for examination of blood pressure without abnormal findings: Secondary | ICD-10-CM

## 2023-05-05 ENCOUNTER — Encounter: Payer: Self-pay | Admitting: Family

## 2023-05-05 NOTE — Assessment & Plan Note (Addendum)
 Starting weight: 223lb, 6.4oz.  Starting BMI: 37.34  Patient has lost significant weight and will benefit from continuing her RX. She will continue her dietary and lifestyle changes along side her prescription to continue her weight loss effectively.

## 2023-05-05 NOTE — Progress Notes (Signed)
 Established Patient Office Visit  Subjective:  Patient ID: Debra Blake, female    DOB: December 17, 1980  Age: 43 y.o. MRN: [id number]  Chief Complaint  Patient presents with   Follow-up    4 month follow up    Patient is here today for her  follow up.  She has been feeling well since last appointment.   She does have additional concerns to discuss today.  She needs her wegovy prior authorization re-authorized. It was denied, so we will do the appeal.  Labs are not due today. She needs refills.   I have reviewed her active problem list, medication list, allergies, health maintenance, notes from last encounter, lab results for her appointment today.    No other concerns at this time.   Past Medical History:  Diagnosis Date   Anxiety    Autoimmune thyroiditis     Past Surgical History:  Procedure Laterality Date   TUBAL LIGATION  2015    Social History   Socioeconomic History   Marital status: Single    Spouse name: Not on file   Number of children: 3   Years of education: Not on file   Highest education level: Not on file  Occupational History   Not on file  Tobacco Use   Smoking status: Every Day    Current packs/day: 1.00    Types: Cigarettes   Smokeless tobacco: Never  Substance and Sexual Activity   Alcohol use: No   Drug use: No   Sexual activity: Yes    Birth control/protection: Surgical  Other Topics Concern   Not on file  Social History Narrative   Not on file   Social Drivers of Health   Financial Resource Strain: Not on file  Food Insecurity: Not on file  Transportation Needs: Not on file  Physical Activity: Not on file  Stress: Not on file  Social Connections: Not on file  Intimate Partner Violence: Not on file    History reviewed. No pertinent family history.  No Known Allergies  Review of Systems  All other systems reviewed and are negative.      Objective:   BP 114/70   Ht 5\' 5"  (1.651 m)   Wt 182 lb 3.2 oz (82.6  kg)   BMI 30.32 kg/m   Vitals:   05/02/23 1506  BP: 114/70  Height: 5\' 5"  (1.651 m)  Weight: 182 lb 3.2 oz (82.6 kg)  BMI (Calculated): 30.32   Wt Readings from Last 3 Encounters:   BMI readings:   04/11/23 178 lb 9.6 oz (81 kg) 37.18 kg/m2  12/27/22 201 lb (91.2 kg) 33.45 kg/m2  09/27/22 224 lb 6.4 oz (101.8 kg) 37.34 kg /m2    Physical Exam Vitals and nursing note reviewed.  Constitutional:      Appearance: Normal appearance. She is normal weight.  HENT:     Head: Normocephalic.  Eyes:     Extraocular Movements: Extraocular movements intact.     Conjunctiva/sclera: Conjunctivae normal.     Pupils: Pupils are equal, round, and reactive to light.  Cardiovascular:     Rate and Rhythm: Normal rate.  Pulmonary:     Effort: Pulmonary effort is normal.  Neurological:     General: No focal deficit present.     Mental Status: She is alert and oriented to person, place, and time. Mental status is at baseline.  Psychiatric:        Mood and Affect: Mood normal.  Behavior: Behavior normal.        Thought Content: Thought content normal.        Judgment: Judgment normal.      No results found for any visits on 05/02/23.  Recent Results (from the past 2160 hours)  NuSwab Vaginitis Plus (VG+)     Status: None   Collection Time: 02/21/23  3:00 PM  Result Value Ref Range   Atopobium vaginae Low - 0 Score   BVAB 2 Low - 0 Score   Megasphaera 1 Low - 0 Score    Comment: Calculate total score by adding the 3 individual bacterial vaginosis (BV) marker scores together.  Total score is interpreted as follows: Total score 0-1: Indicates the absence of BV. Total score   2: Indeterminate for BV. Additional clinical                  data should be evaluated to establish a                  diagnosis. Total score 3-6: Indicates the presence of BV.    Candida albicans, NAA Negative Negative   Candida glabrata, NAA Negative Negative   Trich vag by NAA Negative Negative    Chlamydia trachomatis, NAA Negative Negative   Neisseria gonorrhoeae, NAA Negative Negative       Assessment & Plan:   Problem List Items Addressed This Visit       Other   Class 2 severe obesity due to excess calories with serious comorbidity and body mass index (BMI) of 37.0 to 37.9 in adult (HCC) - Primary   Starting weight: 223lb, 6.4oz.  Starting BMI: 37.34  Patient has lost significant weight and will benefit from continuing her RX. She will continue her dietary and lifestyle changes along side her prescription to continue her weight loss effectively.         Return in about 4 months (around 09/01/2023).   Total time spent: 20 minutes  Trenda Frisk, FNP  05/02/2023   This document may have been prepared by [facility] Voice Recognition software and as such may include unintentional dictation errors.

## 2023-05-26 ENCOUNTER — Other Ambulatory Visit: Payer: Self-pay | Admitting: Family

## 2023-05-26 MED ORDER — WEGOVY 0.5 MG/0.5ML ~~LOC~~ SOAJ: 0.5000 mg | SUBCUTANEOUS | 0 refills | Status: DC

## 2023-06-14 ENCOUNTER — Telehealth: Payer: Self-pay | Admitting: Family

## 2023-06-14 NOTE — Telephone Encounter (Signed)
Patient left VM requesting a call back. Did not state what she needs.

## 2023-06-15 ENCOUNTER — Other Ambulatory Visit: Payer: Self-pay

## 2023-06-15 ENCOUNTER — Other Ambulatory Visit: Payer: Self-pay | Admitting: Family

## 2023-06-15 MED ORDER — WEGOVY 2.4 MG/0.75ML ~~LOC~~ SOAJ
2.4000 mg | SUBCUTANEOUS | 2 refills | Status: DC
Start: 2023-06-15 — End: 2023-06-15

## 2023-06-15 MED ORDER — WEGOVY 1 MG/0.5ML ~~LOC~~ SOAJ: 1.0000 mg | SUBCUTANEOUS | 0 refills | Status: DC

## 2023-06-18 ENCOUNTER — Other Ambulatory Visit: Payer: Self-pay | Admitting: Family

## 2023-07-07 ENCOUNTER — Other Ambulatory Visit: Payer: Self-pay | Admitting: Family

## 2023-08-02 ENCOUNTER — Other Ambulatory Visit: Payer: Self-pay | Admitting: Family

## 2023-09-05 ENCOUNTER — Ambulatory Visit: Admitting: Family

## 2023-09-18 ENCOUNTER — Other Ambulatory Visit: Payer: Self-pay | Admitting: Family

## 2023-09-26 ENCOUNTER — Encounter: Payer: Self-pay | Admitting: Family

## 2023-09-26 ENCOUNTER — Ambulatory Visit (INDEPENDENT_AMBULATORY_CARE_PROVIDER_SITE_OTHER): Admitting: Family

## 2023-09-26 VITALS — BP 100/74 | HR 78 | Ht 65.0 in | Wt 175.6 lb

## 2023-09-26 DIAGNOSIS — H539 Unspecified visual disturbance: Secondary | ICD-10-CM

## 2023-09-26 DIAGNOSIS — E559 Vitamin D deficiency, unspecified: Secondary | ICD-10-CM

## 2023-09-26 DIAGNOSIS — R7303 Prediabetes: Secondary | ICD-10-CM | POA: Diagnosis not present

## 2023-09-26 DIAGNOSIS — Z6837 Body mass index (BMI) 37.0-37.9, adult: Secondary | ICD-10-CM

## 2023-09-26 DIAGNOSIS — E538 Deficiency of other specified B group vitamins: Secondary | ICD-10-CM

## 2023-09-26 DIAGNOSIS — L209 Atopic dermatitis, unspecified: Secondary | ICD-10-CM

## 2023-09-26 DIAGNOSIS — Z0130 Encounter for examination of blood pressure without abnormal findings: Secondary | ICD-10-CM

## 2023-09-26 DIAGNOSIS — E782 Mixed hyperlipidemia: Secondary | ICD-10-CM

## 2023-09-26 DIAGNOSIS — E66812 Obesity, class 2: Secondary | ICD-10-CM

## 2023-09-26 DIAGNOSIS — D369 Benign neoplasm, unspecified site: Secondary | ICD-10-CM

## 2023-09-26 DIAGNOSIS — F419 Anxiety disorder, unspecified: Secondary | ICD-10-CM

## 2023-09-26 DIAGNOSIS — E063 Autoimmune thyroiditis: Secondary | ICD-10-CM | POA: Diagnosis not present

## 2023-09-26 NOTE — Assessment & Plan Note (Signed)
.  A1C Continues to be in prediabetic ranges.  Will reassess at follow up after next lab check.  Patient counseled on dietary choices and verbalized understanding.   -CBC w/Diff -CMP w/eGFR -Hemoglobin A1C

## 2023-09-26 NOTE — Assessment & Plan Note (Signed)
 Checking labs today.  Continue current therapy for lipid control. Will modify as needed based on labwork results.   -CMP w/eGFR -Lipid Panel

## 2023-09-26 NOTE — Assessment & Plan Note (Signed)
 Patient stable.  Well controlled with current therapy.   Continue current meds.

## 2023-09-26 NOTE — Progress Notes (Unsigned)
 Established Patient Office Visit  Subjective:  Patient ID: Debra Blake, female    DOB: Jun 10, 1980  Age: 43 y.o. MRN: [id number]  Chief Complaint  Patient presents with  . Follow-up    4 month follow up    Patient is here today for her 4 months follow up.  She has been feeling fairly well since last appointment.   She does have additional concerns to discuss today.   1) spot on her leg, some spots on her face.  Spot on her leg has gotten bigger, itches sometimes. Is causing issues because she keeps hitting it with her razor.  2) went to UC a few weeks ago, had some issues with her chest hurting. They did an EKG, no abnormalities, but did have some wheezing.  Asks if we can recheck.  3) Has been having some blurry vision, asks if we can set up referral to Eye dr.   [facility] are due today, has been a while.  She needs refills.   I have reviewed her active problem list, medication list, allergies, notes from last encounter, lab results for her appointment today.      No other concerns at this time.   Past Medical History:  Diagnosis Date  . Anxiety   . Autoimmune thyroiditis     Past Surgical History:  Procedure Laterality Date  . TUBAL LIGATION  2015    Social History   Socioeconomic History  . Marital status: Single    Spouse name: Not on file  . Number of children: 3  . Years of education: Not on file  . Highest education level: Not on file  Occupational History  . Not on file  Tobacco Use  . Smoking status: Every Day    Current packs/day: 1.00    Types: Cigarettes  . Smokeless tobacco: Never  Substance and Sexual Activity  . Alcohol use: No  . Drug use: No  . Sexual activity: Yes    Birth control/protection: Surgical  Other Topics Concern  . Not on file  Social History Narrative  . Not on file   Social Drivers of Health   Financial Resource Strain: Not on file  Food Insecurity: Not on file  Transportation Needs: Not on file  Physical  Activity: Not on file  Stress: Not on file  Social Connections: Not on file  Intimate Partner Violence: Not on file    No family history on file.  No Known Allergies  Review of Systems  All other systems reviewed and are negative.      Objective:   BP 100/74   Pulse 78   Ht 5' 5 (1.651 m)   Wt 175 lb 9.6 oz (79.7 kg)   SpO2 99%   BMI 29.22 kg/m   Vitals:   09/26/23 1508  BP: 100/74  Pulse: 78  Height: 5' 5 (1.651 m)  Weight: 175 lb 9.6 oz (79.7 kg)  SpO2: 99%  BMI (Calculated): 29.22    Physical Exam Vitals and nursing note reviewed.  Constitutional:      Appearance: Normal appearance. She is normal weight.  HENT:     Head: Normocephalic.  Eyes:     Extraocular Movements: Extraocular movements intact.     Conjunctiva/sclera: Conjunctivae normal.     Pupils: Pupils are equal, round, and reactive to light.  Cardiovascular:     Rate and Rhythm: Normal rate.  Pulmonary:     Effort: Pulmonary effort is normal.  Neurological:     General:  No focal deficit present.     Mental Status: She is alert and oriented to person, place, and time. Mental status is at baseline.  Psychiatric:        Mood and Affect: Mood normal.        Behavior: Behavior normal.        Thought Content: Thought content normal.        Judgment: Judgment normal.      No results found for any visits on 09/26/23.  No results found for this or any previous visit (from the past 2160 hours).     Assessment & Plan Mixed hyperlipidemia Checking labs today.  Continue current therapy for lipid control. Will modify as needed based on labwork results.   -CMP w/eGFR -Lipid Panel  Prediabetes A1C Continues to be in prediabetic ranges.  Will reassess at follow up after next lab check.  Patient counseled on dietary choices and verbalized understanding.   -CBC w/Diff -CMP w/eGFR -Hemoglobin A1C  Vitamin D  deficiency, unspecified Autoimmune thyroiditis B12 deficiency due to  diet Checking labs today.  Will continue supplements as needed.   - Vitamin D  - Vitamin B12 - TSH  Class 2 severe obesity due to excess calories with serious comorbidity and body mass index (BMI) of 37.0 to 37.9 in adult [facility]) Continue current meds.  Will adjust as needed based on results.  The patient is asked to make an attempt to improve diet and exercise patterns to aid in medical management of this problem. Addressed importance of increasing and maintaining water intake.   Anxiety Patient stable.  Well controlled with current therapy.   Continue current meds.   Vision changes  Papilloma Atopic dermatitis of face    No follow-ups on file.   Total time spent: 20 minutes  ALAN CHRISTELLA ARRANT, FNP  09/26/2023   This document may have been prepared by [facility] Voice Recognition software and as such may include unintentional dictation errors.

## 2023-09-26 NOTE — Assessment & Plan Note (Signed)
 Checking labs today.  Will continue supplements as needed.   - Vitamin D  - Vitamin B12 - TSH

## 2023-09-26 NOTE — Assessment & Plan Note (Signed)
 Continue current meds.  Will adjust as needed based on results.  The patient is asked to make an attempt to improve diet and exercise patterns to aid in medical management of this problem. Addressed importance of increasing and maintaining water  intake.

## 2023-09-27 ENCOUNTER — Ambulatory Visit: Payer: Self-pay

## 2023-09-27 LAB — CBC WITH DIFFERENTIAL/PLATELET
Basophils Absolute: 0 x10E3/uL (ref 0.0–0.2)
Basos: 1 %
EOS (ABSOLUTE): 0.3 x10E3/uL (ref 0.0–0.4)
Eos: 5 %
Hematocrit: 43.8 % (ref 34.0–46.6)
Hemoglobin: 14 g/dL (ref 11.1–15.9)
Immature Grans (Abs): 0 x10E3/uL (ref 0.0–0.1)
Immature Granulocytes: 0 %
Lymphocytes Absolute: 2.5 x10E3/uL (ref 0.7–3.1)
Lymphs: 33 %
MCH: 30.2 pg (ref 26.6–33.0)
MCHC: 32 g/dL (ref 31.5–35.7)
MCV: 94 fL (ref 79–97)
Monocytes Absolute: 0.5 x10E3/uL (ref 0.1–0.9)
Monocytes: 7 %
Neutrophils Absolute: 4 x10E3/uL (ref 1.4–7.0)
Neutrophils: 54 %
Platelets: 252 x10E3/uL (ref 150–450)
RBC: 4.64 x10E6/uL (ref 3.77–5.28)
RDW: 15.9 % — ABNORMAL HIGH (ref 11.7–15.4)
WBC: 7.4 x10E3/uL (ref 3.4–10.8)

## 2023-09-27 LAB — LIPID PANEL
Chol/HDL Ratio: 5.7 ratio — ABNORMAL HIGH (ref 0.0–4.4)
Cholesterol, Total: 206 mg/dL — ABNORMAL HIGH (ref 100–199)
HDL: 36 mg/dL — ABNORMAL LOW (ref >39)
LDL Chol Calc (NIH): 142 mg/dL — ABNORMAL HIGH (ref 0–99)
Triglycerides: 157 mg/dL — ABNORMAL HIGH (ref 0–149)
VLDL Cholesterol Cal: 28 mg/dL (ref 5–40)

## 2023-09-27 LAB — HEMOGLOBIN A1C
Est. average glucose Bld gHb Est-mCnc: 97 mg/dL
Hgb A1c MFr Bld: 5 % (ref 4.8–5.6)

## 2023-09-27 LAB — CMP14+EGFR
ALT: 12 IU/L (ref 0–32)
AST: 18 IU/L (ref 0–40)
Albumin: 3.9 g/dL (ref 3.9–4.9)
Alkaline Phosphatase: 49 IU/L (ref 44–121)
BUN/Creatinine Ratio: 14 (ref 9–23)
BUN: 12 mg/dL (ref 6–24)
Bilirubin Total: <0.2 mg/dL (ref 0.0–1.2)
CO2: 24 mmol/L (ref 20–29)
Calcium: 8.9 mg/dL (ref 8.7–10.2)
Chloride: 104 mmol/L (ref 96–106)
Creatinine, Ser: 0.86 mg/dL (ref 0.57–1.00)
Globulin, Total: 2.3 g/dL (ref 1.5–4.5)
Glucose: 69 mg/dL — ABNORMAL LOW (ref 70–99)
Potassium: 3.9 mmol/L (ref 3.5–5.2)
Sodium: 142 mmol/L (ref 134–144)
Total Protein: 6.2 g/dL (ref 6.0–8.5)
eGFR: 87 mL/min/1.73 (ref >59)

## 2023-09-27 LAB — TSH+T4F+T3FREE
Free T4: 1.48 ng/dL (ref 0.82–1.77)
T3, Free: 2.5 pg/mL (ref 2.0–4.4)
TSH: 4.67 u[IU]/mL — ABNORMAL HIGH (ref 0.450–4.500)

## 2023-09-27 LAB — VITAMIN D 25 HYDROXY (VIT D DEFICIENCY, FRACTURES): Vit D, 25-Hydroxy: 31.1 ng/mL (ref 30.0–100.0)

## 2023-09-27 LAB — VITAMIN B12: Vitamin B-12: 362 pg/mL (ref 232–1245)

## 2023-09-28 ENCOUNTER — Encounter: Payer: Self-pay | Admitting: Family

## 2023-09-29 ENCOUNTER — Other Ambulatory Visit: Payer: Self-pay | Admitting: Family

## 2023-10-04 ENCOUNTER — Telehealth: Payer: Self-pay | Admitting: Family

## 2023-10-04 ENCOUNTER — Telehealth: Payer: Self-pay

## 2023-10-04 NOTE — Telephone Encounter (Signed)
 Patient left VM stating the eye doctor we sent the referral to does not accept Medicaid. We need to find out where she wants the referral sent to that does accept Medicaid and then let Hadassah know so that she can resend the referral.

## 2023-10-04 NOTE — Telephone Encounter (Signed)
 Pt called because the ophthalmologist she was referred to does not take her insurance. Pt is hoping to get referred to a different office.

## 2023-10-17 ENCOUNTER — Encounter: Payer: Self-pay | Admitting: Family

## 2023-10-17 ENCOUNTER — Ambulatory Visit: Admitting: Family

## 2023-10-17 VITALS — BP 106/68 | HR 87 | Ht 65.0 in | Wt 176.2 lb

## 2023-10-17 DIAGNOSIS — R079 Chest pain, unspecified: Secondary | ICD-10-CM | POA: Diagnosis not present

## 2023-10-17 DIAGNOSIS — E66812 Obesity, class 2: Secondary | ICD-10-CM

## 2023-10-17 DIAGNOSIS — E063 Autoimmune thyroiditis: Secondary | ICD-10-CM

## 2023-10-17 DIAGNOSIS — Z6837 Body mass index (BMI) 37.0-37.9, adult: Secondary | ICD-10-CM

## 2023-10-17 DIAGNOSIS — E559 Vitamin D deficiency, unspecified: Secondary | ICD-10-CM

## 2023-10-17 DIAGNOSIS — Z0130 Encounter for examination of blood pressure without abnormal findings: Secondary | ICD-10-CM

## 2023-10-17 DIAGNOSIS — E782 Mixed hyperlipidemia: Secondary | ICD-10-CM

## 2023-10-17 MED ORDER — WEGOVY 1.7 MG/0.75ML ~~LOC~~ SOAJ: 1.7000 mg | SUBCUTANEOUS | 0 refills | Status: AC

## 2023-10-17 NOTE — Progress Notes (Signed)
 Established Patient Office Visit  Subjective:  Patient ID: Debra Blake, female    DOB: [birth year]  Age: 43 y.o. MRN: [id number]  Chief Complaint  Patient presents with   Follow-up    3 week follow up    Patient is here today for her 3 week follow up.  She is feeling well overall, but has continued to have issues with occasional chest pains.  She also reports that she would like to decrease the dose of her wegovy  back down to 1.7, she would like to slow her weight loss some.   No other concerns today    No other concerns at this time.   Past Medical History:  Diagnosis Date   Anxiety    Autoimmune thyroiditis     Past Surgical History:  Procedure Laterality Date   TUBAL LIGATION  2015    Social History   Socioeconomic History   Marital status: Single    Spouse name: Not on file   Number of children: 3   Years of education: Not on file   Highest education level: Not on file  Occupational History   Not on file  Tobacco Use   Smoking status: Every Day    Current packs/day: 1.00    Types: Cigarettes   Smokeless tobacco: Never  Substance and Sexual Activity   Alcohol use: No   Drug use: No   Sexual activity: Yes    Birth control/protection: Surgical  Other Topics Concern   Not on file  Social History Narrative   Not on file   Social Drivers of Health   Financial Resource Strain: Not on file  Food Insecurity: Not on file  Transportation Needs: Not on file  Physical Activity: Not on file  Stress: Not on file  Social Connections: Not on file  Intimate Partner Violence: Not on file    No family history on file.  No Known Allergies  Review of Systems  Cardiovascular:  Positive for chest pain and palpitations.  All other systems reviewed and are negative.      Objective:   BP 106/68   Pulse 87   Ht 5' 5 (1.651 m)   Wt 176 lb 3.2 oz (79.9 kg)   SpO2 98%   BMI 29.32 kg/m   Vitals:   10/17/23 1457  BP: 106/68  Pulse: 87  Height:  5' 5 (1.651 m)  Weight: 176 lb 3.2 oz (79.9 kg)  SpO2: 98%  BMI (Calculated): 29.32    Physical Exam Vitals and nursing note reviewed.  Constitutional:      Appearance: Normal appearance. She is normal weight.  HENT:     Head: Normocephalic and atraumatic.     Nose: Nose normal.  Eyes:     Extraocular Movements: Extraocular movements intact.     Conjunctiva/sclera: Conjunctivae normal.     Pupils: Pupils are equal, round, and reactive to light.  Cardiovascular:     Rate and Rhythm: Normal rate.     Pulses: Normal pulses.     Heart sounds: Normal heart sounds.  Pulmonary:     Effort: Pulmonary effort is normal.  Musculoskeletal:        General: Normal range of motion.  Neurological:     General: No focal deficit present.     Mental Status: She is alert and oriented to person, place, and time. Mental status is at baseline.  Psychiatric:        Mood and Affect: Mood normal.  Behavior: Behavior normal.        Thought Content: Thought content normal.        Judgment: Judgment normal.      No results found for any visits on 10/17/23.  Recent Results (from the past 2160 hours)  CMP14+EGFR     Status: Abnormal   Collection Time: 09/26/23  3:51 PM  Result Value Ref Range   Glucose 69 (L) 70 - 99 mg/dL   BUN 12 6 - 24 mg/dL   Creatinine, Ser 9.13 0.57 - 1.00 mg/dL   eGFR 87 >40 fO/fpw/8.26   BUN/Creatinine Ratio 14 9 - 23   Sodium 142 134 - 144 mmol/L   Potassium 3.9 3.5 - 5.2 mmol/L   Chloride 104 96 - 106 mmol/L   CO2 24 20 - 29 mmol/L   Calcium 8.9 8.7 - 10.2 mg/dL   Total Protein 6.2 6.0 - 8.5 g/dL   Albumin 3.9 3.9 - 4.9 g/dL   Globulin, Total 2.3 1.5 - 4.5 g/dL   Bilirubin Total <9.7 0.0 - 1.2 mg/dL   Alkaline Phosphatase 49 44 - 121 IU/L    Comment: **Effective October 03, 2023 Alkaline Phosphatase**   reference interval will be changing to:              Age                Female          Female           0 -  5 days         47 - 127       47 - 127            6 - 10 days         29 - 242       29 - 242          11 - 20 days        109 - 357      109 - 357          21 - 30 days         94 - 494       94 - 494           1 -  2 months      149 - 539      149 - 539           3 -  6 months      131 - 452      131 - 452           7 - 11 months      117 - 401      117 - 401   12 months -  6 years       158 - 369      158 - 369           7 - 12 years       150 - 409      150 - 409               13 years       156 - 435       78 - 227               14 years       114 - 375       64 - 161  15 years        88 - 279       56 - 134               16 years        74 - 207       51 - 121               17 years        63 - 161       47 - 113          18 - 20 years        51 - 125       42 - 106          21 - 50 years         47 - 123       41 - 116          51 - 80 years        49 - 135       51 - 125              >80 years        48 - 129       48 - 129    AST 18 0 - 40 IU/L   ALT 12 0 - 32 IU/L  Lipid panel     Status: Abnormal   Collection Time: 09/26/23  3:51 PM  Result Value Ref Range   Cholesterol, Total 206 (H) 100 - 199 mg/dL   Triglycerides 842 (H) 0 - 149 mg/dL   HDL 36 (L) >60 mg/dL   VLDL Cholesterol Cal 28 5 - 40 mg/dL   LDL Chol Calc (NIH) 857 (H) 0 - 99 mg/dL   Chol/HDL Ratio 5.7 (H) 0.0 - 4.4 ratio    Comment:                                   T. Chol/HDL Ratio                                             Men  Women                               1/2 Avg.Risk  3.4    3.3                                   Avg.Risk  5.0    4.4                                2X Avg.Risk  9.6    7.1                                3X Avg.Risk 23.4   11.0   VITAMIN D  25 Hydroxy (Vit-D Deficiency, Fractures)     Status: None   Collection Time: 09/26/23  3:51 PM  Result Value Ref Range   Vit D, 25-Hydroxy 31.1 30.0 - 100.0 ng/mL  Comment: Vitamin D  deficiency has been defined by the Institute of Medicine and an Endocrine Society  practice guideline as a level of serum 25-OH vitamin D  less than 20 ng/mL (1,2). The Endocrine Society went on to further define vitamin D  insufficiency as a level between 21 and 29 ng/mL (2). 1. IOM (Institute of Medicine). 2010. Dietary reference    intakes for calcium and D. [place]: The    [employer]. 2. Holick MF, [place], Bischoff-Ferrari HA, et al.    Evaluation, treatment, and prevention of vitamin D     deficiency: an Endocrine Society clinical practice    guideline. JCEM. 2011 Jul; 96(7):1911-30.   Vitamin B12     Status: None   Collection Time: 09/26/23  3:51 PM  Result Value Ref Range   Vitamin B-12 362 232 - 1,245 pg/mL  CBC with Diff     Status: Abnormal   Collection Time: 09/26/23  3:51 PM  Result Value Ref Range   WBC 7.4 3.4 - 10.8 x10E3/uL   RBC 4.64 3.77 - 5.28 x10E6/uL   Hemoglobin 14.0 11.1 - 15.9 g/dL   Hematocrit 56.1 65.9 - 46.6 %   MCV 94 79 - 97 fL   MCH 30.2 26.6 - 33.0 pg   MCHC 32.0 31.5 - 35.7 g/dL   RDW 84.0 (H) 88.2 - 84.5 %   Platelets 252 150 - 450 x10E3/uL   Neutrophils 54 Not Estab. %   Lymphs 33 Not Estab. %   Monocytes 7 Not Estab. %   Eos 5 Not Estab. %   Basos 1 Not Estab. %   Neutrophils Absolute 4.0 1.4 - 7.0 x10E3/uL   Lymphocytes Absolute 2.5 0.7 - 3.1 x10E3/uL   Monocytes Absolute 0.5 0.1 - 0.9 x10E3/uL   EOS (ABSOLUTE) 0.3 0.0 - 0.4 x10E3/uL   Basophils Absolute 0.0 0.0 - 0.2 x10E3/uL   Immature Granulocytes 0 Not Estab. %   Immature Grans (Abs) 0.0 0.0 - 0.1 x10E3/uL  Hemoglobin A1c     Status: None   Collection Time: 09/26/23  3:51 PM  Result Value Ref Range   Hgb A1c MFr Bld 5.0 4.8 - 5.6 %    Comment:          Prediabetes: 5.7 - 6.4          Diabetes: >6.4          Glycemic control for adults with diabetes: <7.0    Est. average glucose Bld gHb Est-mCnc 97 mg/dL  [id number]     Status: Abnormal   Collection Time: 09/26/23  3:51 PM  Result Value Ref Range   TSH 4.670 (H) 0.450 - 4.500 uIU/mL    T3, Free 2.5 2.0 - 4.4 pg/mL   Free T4 1.48 0.82 - 1.77 ng/dL       Assessment & Plan Chest pain, unspecified type Setting patient up for referral to cardiology.  Will defer to them for further treatment.   Autoimmune thyroiditis Patient stable.  Well controlled with current therapy.   Continue current meds.   Class 2 severe obesity due to excess calories with serious comorbidity and body mass index (BMI) of 37.0 to 37.9 in adult Decrease Wegovy  to 1.7 mg dose.  New RX sent.   Will reassess at follow up.  Vitamin D  deficiency, unspecified Will continue supplements as needed.   Mixed hyperlipidemia Continue current therapy for lipid control. Will modify as needed based on labwork results.      Return in about 1 month (  around 11/16/2023).   Total time spent: 20 minutes  ALAN CHRISTELLA ARRANT, FNP  10/17/2023   This document may have been prepared by [facility] Voice Recognition software and as such may include unintentional dictation errors.

## 2023-10-22 ENCOUNTER — Encounter: Payer: Self-pay | Admitting: Family

## 2023-10-22 NOTE — Assessment & Plan Note (Signed)
 Decrease Wegovy  to 1.7 mg dose.  New RX sent.   Will reassess at follow up.

## 2023-10-22 NOTE — Assessment & Plan Note (Signed)
 Continue current therapy for lipid control. Will modify as needed based on labwork results.

## 2023-10-22 NOTE — Assessment & Plan Note (Signed)
 Patient stable.  Well controlled with current therapy.   Continue current meds.

## 2023-10-22 NOTE — Assessment & Plan Note (Signed)
 Will continue supplements as needed.

## 2023-10-24 ENCOUNTER — Ambulatory Visit: Admitting: Cardiovascular Disease

## 2023-10-24 ENCOUNTER — Encounter: Payer: Self-pay | Admitting: Cardiovascular Disease

## 2023-10-24 VITALS — BP 120/72 | HR 85 | Ht 65.0 in | Wt 176.8 lb

## 2023-10-24 DIAGNOSIS — E782 Mixed hyperlipidemia: Secondary | ICD-10-CM | POA: Diagnosis not present

## 2023-10-24 DIAGNOSIS — R7303 Prediabetes: Secondary | ICD-10-CM

## 2023-10-24 DIAGNOSIS — E66812 Obesity, class 2: Secondary | ICD-10-CM | POA: Diagnosis not present

## 2023-10-24 DIAGNOSIS — F17209 Nicotine dependence, unspecified, with unspecified nicotine-induced disorders: Secondary | ICD-10-CM

## 2023-10-24 DIAGNOSIS — Z0130 Encounter for examination of blood pressure without abnormal findings: Secondary | ICD-10-CM

## 2023-10-24 DIAGNOSIS — F17200 Nicotine dependence, unspecified, uncomplicated: Secondary | ICD-10-CM

## 2023-10-24 DIAGNOSIS — Z6837 Body mass index (BMI) 37.0-37.9, adult: Secondary | ICD-10-CM

## 2023-10-24 DIAGNOSIS — R0789 Other chest pain: Secondary | ICD-10-CM

## 2023-10-24 DIAGNOSIS — R0602 Shortness of breath: Secondary | ICD-10-CM

## 2023-10-24 MED ORDER — PANTOPRAZOLE SODIUM 40 MG PO TBEC: 40.0000 mg | DELAYED_RELEASE_TABLET | Freq: Every day | ORAL | 1 refills | Status: DC

## 2023-10-24 MED ORDER — ROSUVASTATIN CALCIUM 20 MG PO TABS: 20.0000 mg | ORAL_TABLET | Freq: Every day | ORAL | 11 refills | Status: AC

## 2023-10-24 NOTE — Progress Notes (Signed)
 Cardiology Office Note   Date:  10/24/2023   ID:  Debra Blake, DOB 05-Mar-1980, MRN [id number]  PCP:  Orlean Alan HERO, FNP  Cardiologist:  Denyse Bathe, MD      History of Present Illness: Debra Blake is a 43 y.o. female who presents for  Chief Complaint  Patient presents with   Consult    Cardiac Consult    [id number] presents with chest pain which got worse last month and had to go to urgent care.  Chest Pain  This is a new problem. The current episode started more than 1 month ago. The problem has been gradually worsening. The pain is present in the lateral region. The pain is at a severity of 7/10. The pain is severe. The quality of the pain is described as tightness and sharp. Associated symptoms include malaise/fatigue, nausea and shortness of breath. Pertinent negatives include no diaphoresis, near-syncope, numbness, PND, vomiting or weakness.      Past Medical History:  Diagnosis Date   Anxiety    Autoimmune thyroiditis      Past Surgical History:  Procedure Laterality Date   TUBAL LIGATION  2015     Current Outpatient Medications  Medication Sig Dispense Refill   pantoprazole (PROTONIX) 40 MG tablet Take 1 tablet (40 mg total) by mouth daily. 30 tablet 1   rosuvastatin (CRESTOR) 20 MG tablet Take 1 tablet (20 mg total) by mouth daily. 30 tablet 11   levothyroxine  (SYNTHROID ) 137 MCG tablet TAKE 1 TABLET BY MOUTH ONCE DAILY BEFORE BREAKFAST 90 tablet 0   semaglutide -weight management (WEGOVY ) 1.7 MG/0.75ML SOAJ SQ injection Inject 1.7 mg into the skin once a week. 9 mL 0   VENTOLIN  HFA 108 (90 Base) MCG/ACT inhaler Inhale 1-2 puffs into the lungs every 4 (four) hours as needed for wheezing or shortness of breath. 18 g 3   WEGOVY  2.4 MG/0.75ML SOAJ SQ injection INJECT 2.4 MG SUBCUTANEOUSLY ONCE A WEEK 4 mL 0   No current facility-administered medications for this visit.    Allergies:   Patient has no known allergies.    Social History:    reports that she has been smoking cigarettes. She has never used smokeless tobacco. She reports that she does not drink alcohol and does not use drugs.   Family History:  family history is not on file.    ROS:     Review of Systems  Constitutional:  Positive for malaise/fatigue. Negative for diaphoresis.  HENT: Negative.    Eyes: Negative.   Respiratory:  Positive for shortness of breath.   Cardiovascular:  Positive for chest pain. Negative for PND and near-syncope.  Gastrointestinal:  Positive for nausea. Negative for vomiting.  Genitourinary: Negative.   Musculoskeletal: Negative.   Skin: Negative.   Neurological: Negative.  Negative for weakness and numbness.  Endo/Heme/Allergies: Negative.   Psychiatric/Behavioral: Negative.    All other systems reviewed and are negative.     All other systems are reviewed and negative.    PHYSICAL EXAM: VS:  BP 120/72   Pulse 85   Ht 5' 5 (1.651 m)   Wt 176 lb 12.8 oz (80.2 kg)   SpO2 97%   BMI 29.42 kg/m  , BMI Body mass index is 29.42 kg/m. Last weight:  Wt Readings from Last 3 Encounters:  10/24/23 176 lb 12.8 oz (80.2 kg)  10/17/23 176 lb 3.2 oz (79.9 kg)  09/26/23 175 lb 9.6 oz (79.7 kg)     Physical Exam Constitutional:  Appearance: Normal appearance.  Cardiovascular:     Rate and Rhythm: Normal rate and regular rhythm.     Heart sounds: Normal heart sounds.  Pulmonary:     Effort: Pulmonary effort is normal.     Breath sounds: Normal breath sounds.  Musculoskeletal:     Right lower leg: No edema.     Left lower leg: No edema.  Neurological:     Mental Status: She is alert.       EKG: NSR 79/MIN NO ACUTE CHANGES  Recent Labs: 09/26/2023: ALT 12; BUN 12; Creatinine, Ser 0.86; Hemoglobin 14.0; Platelets 252; Potassium 3.9; Sodium 142; TSH 4.670    Lipid Panel    Component Value Date/Time   CHOL 206 (H) 09/26/2023 1551   TRIG 157 (H) 09/26/2023 1551   HDL 36 (L) 09/26/2023 1551   CHOLHDL 5.7 (H)  09/26/2023 1551   LDLCALC 142 (H) 09/26/2023 1551      Other studies Reviewed: Additional studies/ records that were reviewed today include:  Review of the above records demonstrates:       No data to display            ASSESSMENT AND PLAN:    ICD-10-CM   1. Prediabetes  R73.03 MYOCARDIAL PERFUSION IMAGING    PCV ECHOCARDIOGRAM COMPLETE    pantoprazole (PROTONIX) 40 MG tablet    rosuvastatin (CRESTOR) 20 MG tablet    2. Mixed hyperlipidemia  E78.2 MYOCARDIAL PERFUSION IMAGING    PCV ECHOCARDIOGRAM COMPLETE    pantoprazole (PROTONIX) 40 MG tablet    rosuvastatin (CRESTOR) 20 MG tablet   LDL 142, TOTAL CHOLESTROL 793, ADD CRESTOR 20. HDL 36.LDL HAS GONE UP SINCE 12/24.    3. Class 2 severe obesity due to excess calories with serious comorbidity and body mass index (BMI) of 37.0 to 37.9 in adult  E66.812 MYOCARDIAL PERFUSION IMAGING   Z68.37 PCV ECHOCARDIOGRAM COMPLETE    pantoprazole (PROTONIX) 40 MG tablet    rosuvastatin (CRESTOR) 20 MG tablet    4. Other chest pain  R07.89 MYOCARDIAL PERFUSION IMAGING    PCV ECHOCARDIOGRAM COMPLETE    pantoprazole (PROTONIX) 40 MG tablet    rosuvastatin (CRESTOR) 20 MG tablet   RECURRENT CHEST PAIN, IN SMOKER WITH HYPERLIPEDEMIA, AND FAMILY H/O CAD. ADVISE ECHO AND STRESS TEST.    5. SOB (shortness of breath)  R06.02 MYOCARDIAL PERFUSION IMAGING    PCV ECHOCARDIOGRAM COMPLETE    pantoprazole (PROTONIX) 40 MG tablet    rosuvastatin (CRESTOR) 20 MG tablet    6. Smoker  F17.200 MYOCARDIAL PERFUSION IMAGING    PCV ECHOCARDIOGRAM COMPLETE    pantoprazole (PROTONIX) 40 MG tablet    rosuvastatin (CRESTOR) 20 MG tablet       Problem List Items Addressed This Visit       Other   Mixed hyperlipidemia   Relevant Medications   pantoprazole (PROTONIX) 40 MG tablet   rosuvastatin (CRESTOR) 20 MG tablet   Other Relevant Orders   MYOCARDIAL PERFUSION IMAGING   PCV ECHOCARDIOGRAM COMPLETE   Class 2 severe obesity due to excess  calories with serious comorbidity and body mass index (BMI) of 37.0 to 37.9 in adult   Relevant Medications   pantoprazole (PROTONIX) 40 MG tablet   rosuvastatin (CRESTOR) 20 MG tablet   Other Relevant Orders   MYOCARDIAL PERFUSION IMAGING   PCV ECHOCARDIOGRAM COMPLETE   Prediabetes - Primary   Relevant Medications   pantoprazole (PROTONIX) 40 MG tablet   rosuvastatin (CRESTOR) 20 MG tablet  Other Relevant Orders   MYOCARDIAL PERFUSION IMAGING   PCV ECHOCARDIOGRAM COMPLETE   Other Visit Diagnoses       Other chest pain       RECURRENT CHEST PAIN, IN SMOKER WITH HYPERLIPEDEMIA, AND FAMILY H/O CAD. ADVISE ECHO AND STRESS TEST.   Relevant Medications   pantoprazole (PROTONIX) 40 MG tablet   rosuvastatin (CRESTOR) 20 MG tablet   Other Relevant Orders   MYOCARDIAL PERFUSION IMAGING   PCV ECHOCARDIOGRAM COMPLETE     SOB (shortness of breath)       Relevant Medications   pantoprazole (PROTONIX) 40 MG tablet   rosuvastatin (CRESTOR) 20 MG tablet   Other Relevant Orders   MYOCARDIAL PERFUSION IMAGING   PCV ECHOCARDIOGRAM COMPLETE     Smoker       Relevant Medications   pantoprazole (PROTONIX) 40 MG tablet   rosuvastatin (CRESTOR) 20 MG tablet   Other Relevant Orders   MYOCARDIAL PERFUSION IMAGING   PCV ECHOCARDIOGRAM COMPLETE          Disposition:   Return in about 3 weeks (around 11/14/2023) for ECHO, STRESS TEST AND F/U.    Total time spent: 50 minutes  Signed,  Denyse Bathe, MD  10/24/2023 1:44 PM    [facility]

## 2023-10-31 ENCOUNTER — Ambulatory Visit (INDEPENDENT_AMBULATORY_CARE_PROVIDER_SITE_OTHER)

## 2023-10-31 DIAGNOSIS — I781 Nevus, non-neoplastic: Secondary | ICD-10-CM

## 2023-10-31 DIAGNOSIS — W908XXA Exposure to other nonionizing radiation, initial encounter: Secondary | ICD-10-CM

## 2023-10-31 DIAGNOSIS — L819 Disorder of pigmentation, unspecified: Secondary | ICD-10-CM

## 2023-10-31 DIAGNOSIS — L578 Other skin changes due to chronic exposure to nonionizing radiation: Secondary | ICD-10-CM

## 2023-10-31 DIAGNOSIS — L814 Other melanin hyperpigmentation: Secondary | ICD-10-CM

## 2023-10-31 DIAGNOSIS — L988 Other specified disorders of the skin and subcutaneous tissue: Secondary | ICD-10-CM

## 2023-10-31 DIAGNOSIS — L219 Seborrheic dermatitis, unspecified: Secondary | ICD-10-CM

## 2023-10-31 DIAGNOSIS — D229 Melanocytic nevi, unspecified: Secondary | ICD-10-CM

## 2023-10-31 DIAGNOSIS — D1801 Hemangioma of skin and subcutaneous tissue: Secondary | ICD-10-CM

## 2023-10-31 DIAGNOSIS — L821 Other seborrheic keratosis: Secondary | ICD-10-CM

## 2023-10-31 MED ORDER — TRETINOIN 0.025 % EX CREA: TOPICAL_CREAM | Freq: Every day | CUTANEOUS | 5 refills | Status: DC

## 2023-10-31 MED ORDER — KETOCONAZOLE 2 % EX CREA: TOPICAL_CREAM | CUTANEOUS | 5 refills | Status: AC

## 2023-10-31 NOTE — Patient Instructions (Addendum)
 Topical Retinoids:    What are Topical Retinoids? - These are prescription topical medications, used to treat acne, sun damage, fine wrinkles, and several other skin changes on the face. Medications in this family include tretinoin/Retin-A, adapalene/Differin, and Tazorac, among others.   Instructions for Use: 1. These medications should be used at night because some can be inactivated by sunlight. 2. Wash the face with gentle cleanser such as Cetaphil (no salicylic acid or other acne-washes, which can break down the medication), and let it dry completely before applying the topical retinoid.  It is best to wait 10-15 minutes after washing to apply the medicine. 3. Use a small (pea sized) amount of medication with each application. This should cover the whole face.  Apply by dotting small amounts on the skin, and then blend. Avoid applying medication to the areas right around eyes and lips.   4. Topical retinoids can be drying/irritating to the skin, which varies from person to person, and with the strength and type of the medication used. We often recommend starting to use the topical retinoid medication every 2-3 nights, and increase the frequency gradually up to nightly as tolerated over several weeks' time. This irritation typically improves as time goes by, and your skin will respond better if you are able to use the medication more consistently.  5. If you are still experiencing dryness/irritation, you may apply a moisturizer after your toipcal retinoid, such as Cerave PM, Cetaphil, or other bland facial moisturizer. Do not use other night creams or anti-aging creams.  6. These medications take 6 weeks of consistent use to see benefits.    A Note on Cosmetic Use: - Please note, if you are over the age of 35, insurance will typically not cover these medications. If they are recommended to you for non-medically necessary reasons, you may choose to pay for the medication out of pocket. Prices  vary, but a tube of generic tretinoin can often be purchased for ~$60-100 (this size often lasts several months). This varies considerably, and we recommended that you check www.goodrx.com for prescription discount coupons and to compare prices across pharmacies.   These medications should not be used if you are pregnant, planning to become pregnant soon, or breastfeeding.   Please do not hesitate to contact our office with any questions regarding your medication.    Due to recent changes in healthcare laws, you may see results of your pathology and/or laboratory studies on MyChart before the doctors have had a chance to review them. We understand that in some cases there may be results that are confusing or concerning to you. Please understand that not all results are received at the same time and often the doctors may need to interpret multiple results in order to provide you with the best plan of care or course of treatment. Therefore, we ask that you please give us  2 business days to thoroughly review all your results before contacting the office for clarification. Should we see a critical lab result, you will be contacted sooner.   If You Need Anything After Your Visit  If you have any questions or concerns for your doctor, please call our main line at [phone] and press option 4 to reach your doctor's medical assistant. If no one answers, please leave a voicemail as directed and we will return your call as soon as possible. Messages left after 4 pm will be answered the following business day.   You may also send us  a message via MyChart.  We typically respond to MyChart messages within 1-2 business days.  For prescription refills, please ask your pharmacy to contact our office. Our fax number is [phone].  If you have an urgent issue when the clinic is closed that cannot wait until the next business day, you can page your doctor at the number below.    Please note that while we do  our best to be available for urgent issues outside of office hours, we are not available 24/7.   If you have an urgent issue and are unable to reach us , you may choose to seek medical care at your doctor's office, retail clinic, urgent care center, or emergency room.  If you have a medical emergency, please immediately call 911 or go to the emergency department.  Pager Numbers  - Dr. Hester: [phone]  - Dr. Jackquline: [phone]  - Dr. Claudene: [phone]   - Dr. Raymund: [phone]  In the event of inclement weather, please call our main line at [phone] for an update on the status of any delays or closures.  Dermatology Medication Tips: Please keep the boxes that topical medications come in in order to help keep track of the instructions about where and how to use these. Pharmacies typically print the medication instructions only on the boxes and not directly on the medication tubes.   If your medication is too expensive, please contact our office at [phone] option 4 or send us  a message through MyChart.   We are unable to tell what your co-pay for medications will be in advance as this is different depending on your insurance coverage. However, we may be able to find a substitute medication at lower cost or fill out paperwork to get insurance to cover a needed medication.   If a prior authorization is required to get your medication covered by your insurance company, please allow us  1-2 business days to complete this process.  Drug prices often vary depending on where the prescription is filled and some pharmacies may offer cheaper prices.  The website www.goodrx.com contains coupons for medications through different pharmacies. The prices here do not account for what the cost may be with help from insurance (it may be cheaper with your insurance), but the website can give you the price if you did not use any insurance.  - You can print the associated coupon and take  it with your prescription to the pharmacy.  - You may also stop by our office during regular business hours and pick up a GoodRx coupon card.  - If you need your prescription sent electronically to a different pharmacy, notify our office through [facility] or by phone at [phone] option 4.     Si Usted Necesita Algo Despus de Su Visita  Tambin puede enviarnos un mensaje a travs de Clinical cytogeneticist. Por lo general respondemos a los mensajes de MyChart en el transcurso de 1 a 2 das hbiles.  Para renovar recetas, por favor pida a su farmacia que se ponga en contacto con nuestra oficina. Randi lakes de fax es [place] [phone].  Si tiene un asunto urgente cuando la clnica est cerrada y que no puede esperar hasta el siguiente da hbil, puede llamar/localizar a su doctor(a) al nmero que aparece a continuacin.   Por favor, tenga en cuenta que aunque hacemos todo lo posible para estar disponibles para asuntos urgentes fuera del horario de [place], no estamos disponibles las 24 horas del da, los 7 [address]  [address] de la [place].   Si tiene  un problema urgente y no puede comunicarse con nosotros, puede optar por buscar atencin mdica  en el consultorio de su doctor(a), en una clnica privada, en un centro de atencin urgente o en una sala de emergencias.  Si tiene Engineer, drilling, por favor llame inmediatamente al 911 o vaya a la sala de emergencias.  Nmeros de bper  - Dr. Hester: [phone]  - Dra. Jackquline: [phone]  - Dr. Claudene: [phone]  - Dra. Kitts: [phone]  En caso de inclemencias del [place], por favor llame a nuestra lnea principal al [phone] para una actualizacin sobre el estado de cualquier retraso o cierre.  Consejos para la medicacin en dermatologa: Por favor, guarde las cajas en las que vienen los medicamentos de uso tpico para ayudarle a seguir las instrucciones sobre dnde y cmo usarlos. Las farmacias generalmente imprimen las  instrucciones del medicamento slo en las cajas y no directamente en los tubos del [place].   Si su medicamento es muy caro, por favor, pngase en contacto con landry rieger llamando al [phone] y presione la opcin 4 o envenos un mensaje a travs de Clinical cytogeneticist.   No podemos decirle cul ser su copago por los medicamentos por adelantado ya que esto es diferente dependiendo de la cobertura de su seguro. Sin embargo, es posible que podamos encontrar un medicamento sustituto a Audiological scientist un formulario para que el seguro cubra el medicamento que se considera necesario.   Si se requiere una autorizacin previa para que su compaa de seguros malta su medicamento, por favor permtanos de 1 a 2 das hbiles para completar este proceso.  Los precios de los medicamentos varan con frecuencia dependiendo del Environmental consultant de dnde se surte la receta y alguna farmacias pueden ofrecer precios ms baratos.  El sitio web www.goodrx.com tiene cupones para medicamentos de Health and safety inspector. Los precios aqu no tienen en cuenta lo que podra costar con la ayuda del seguro (puede ser ms barato con su seguro), pero el sitio web puede darle el precio si no utiliz Tourist information centre manager.  - Puede imprimir el cupn correspondiente y llevarlo con su receta a la farmacia.  - Tambin puede pasar por nuestra oficina durante el horario de atencin regular y Education officer, museum una tarjeta de cupones de GoodRx.  - Si necesita que su receta se enve electrnicamente a una farmacia diferente, informe a nuestra oficina a travs de MyChart de [place] o por telfono llamando al [phone] y presione la opcin 4.

## 2023-10-31 NOTE — Progress Notes (Signed)
    Subjective   Debra Blake is a 43 y.o. female who presents for the following: Lesion(s) of concern . Patient is new patient  Today patient reports: Lesions of concern the posterior right lower extremity and face. States lesion on RLE fell off.  She also c/o red patchy skin on her face. Now clear   Review of Systems:    No other skin or systemic complaints except as noted in HPI or Assessment and Plan.  The following portions of the chart were reviewed this encounter and updated as appropriate: medications, allergies, medical history  Relevant Medical History:  n/a   Objective  Well appearing patient in no apparent distress; mood and affect are within normal limits. Examination was performed of the: Sun Exposed Exam: Scalp, head, eyes, ears, nose, lips, neck, upper extremities, hands, fingers, fingernails  Examination notable for: Angioma(s): Scattered red vascular papule(s)  , Lentigo/lentigines: Scattered pigmented macules that are tan to brown in color and are somewhat non-uniform in shape and concentrated in the sun-exposed areas, Nevus/nevi: Scattered well-demarcated, regular, pigmented macule(s) and/or papule(s)  , Seborrheic Keratosis(es): Stuck-on appearing keratotic papule(s) on the trunk, none  irritated with redness, crusting, edema, and/or partial avulsion, Actinic Damage/Elastosis: chronic sun damage: dyspigmentation, telangiectasia, and wrinkling  RLE w/ scarred papule  Face with hyperpigmentation, telangiectasias  Examination limited by: Clothing and Patient deferred removal       Assessment & Plan   BENIGN SKIN FINDINGS  - Lentigines  - Seborrheic keratoses  - Hemangiomas   - Nevus/Multiple Benign Nevi - Reassurance provided regarding the benign appearance of lesions noted on exam today; no treatment is indicated in the absence of symptoms/changes. - Reinforced importance of photoprotective strategies including liberal and frequent sunscreen use of a  broad-spectrum SPF 30 or greater, use of protective clothing, and sun avoidance for prevention of cutaneous malignancy and photoaging.  Counseled patient on the importance of regular self-skin monitoring as well as routine clinical skin examinations as scheduled.   ACTINIC DAMAGE - Chronic condition, secondary to cumulative UV/sun exposure - Recommend daily broad spectrum sunscreen SPF 30+ to sun-exposed areas, reapply every 2 hours as needed.  - Staying in the shade or wearing long sleeves, sun glasses (UVA+UVB protection) and wide brim hats (4-inch brim around the entire circumference of the hat) are also recommended for sun protection.  - Call for new or changing lesions.  Perinasal eruption - clear today - favor Seborrheic dermatitis Chronic and persistent condition with duration or expected duration over one year. Condition is stable and at treatment goal.  - Discussed diagnosis, typical course, and treatment options for this condition - Explained to the patient the chronic nature of this diagnosis - Start ketoconazole cream daily prn   Hyperpigmentation, telangiectasias on face  Facial elastosis  - Discussed cosmetic laser  - start tretinoin 0.025% cream in the evening. Educated patient about proper use and potential side effects, including dryness, irritation, sun sensitivity, and transient worsening of acne. - Recommended OTC eucerin radiant tone   Level of service outlined above   Procedures, orders, diagnosis for this visit:    There are no diagnoses linked to this encounter.  Return to clinic: Return if symptoms worsen or fail to improve.  Documentation: I have reviewed the above documentation for accuracy and completeness, and I agree with the above.  Lauraine JAYSON Kanaris, MD

## 2023-11-08 ENCOUNTER — Telehealth: Payer: Self-pay

## 2023-11-08 NOTE — Telephone Encounter (Signed)
 Tretinoin no longer covered by Medicaid. Preferred drugs adapalene/bp (generic Epiduo gel and Epiduo Forte), adapalene gel, azelaic acid gel, clindamycin lotion, clindamycin pledgets, clindamycin-bp gel, Differin gel pump/lotion/cream, erythromycin gel/solution, erythromycin-bp gel, Finacea gel. Please advise.

## 2023-11-09 MED ORDER — ADAPALENE 0.3 % EX GEL: CUTANEOUS | 5 refills | Status: AC

## 2023-11-10 ENCOUNTER — Telehealth: Payer: Self-pay

## 2023-11-10 NOTE — Telephone Encounter (Signed)
 Patient Debra Blake stating that she has been sick for over a week, she's requesting an abx,  she wheezing and coughing

## 2023-11-11 ENCOUNTER — Ambulatory Visit

## 2023-11-11 ENCOUNTER — Encounter: Payer: Self-pay | Admitting: Cardiovascular Disease

## 2023-11-11 DIAGNOSIS — R0789 Other chest pain: Secondary | ICD-10-CM

## 2023-11-11 DIAGNOSIS — Z6837 Body mass index (BMI) 37.0-37.9, adult: Secondary | ICD-10-CM

## 2023-11-11 DIAGNOSIS — I361 Nonrheumatic tricuspid (valve) insufficiency: Secondary | ICD-10-CM

## 2023-11-11 DIAGNOSIS — I340 Nonrheumatic mitral (valve) insufficiency: Secondary | ICD-10-CM | POA: Diagnosis not present

## 2023-11-11 DIAGNOSIS — R7303 Prediabetes: Secondary | ICD-10-CM

## 2023-11-11 DIAGNOSIS — E782 Mixed hyperlipidemia: Secondary | ICD-10-CM

## 2023-11-11 DIAGNOSIS — F17200 Nicotine dependence, unspecified, uncomplicated: Secondary | ICD-10-CM

## 2023-11-11 DIAGNOSIS — R0602 Shortness of breath: Secondary | ICD-10-CM

## 2023-11-21 ENCOUNTER — Encounter: Payer: Self-pay | Admitting: Family

## 2023-11-21 ENCOUNTER — Ambulatory Visit: Admitting: Family

## 2023-11-21 VITALS — BP 120/84 | HR 86 | Ht 65.0 in | Wt 179.2 lb

## 2023-11-21 DIAGNOSIS — Z0130 Encounter for examination of blood pressure without abnormal findings: Secondary | ICD-10-CM

## 2023-11-21 DIAGNOSIS — R5383 Other fatigue: Secondary | ICD-10-CM | POA: Diagnosis not present

## 2023-11-21 DIAGNOSIS — R197 Diarrhea, unspecified: Secondary | ICD-10-CM

## 2023-11-21 DIAGNOSIS — G4719 Other hypersomnia: Secondary | ICD-10-CM | POA: Diagnosis not present

## 2023-11-21 DIAGNOSIS — J069 Acute upper respiratory infection, unspecified: Secondary | ICD-10-CM

## 2023-11-21 DIAGNOSIS — Z131 Encounter for screening for diabetes mellitus: Secondary | ICD-10-CM

## 2023-11-21 NOTE — Progress Notes (Signed)
 Established Patient Office Visit  Subjective:  Patient ID: Debra Blake, female    DOB: Jul 18, 1980  Age: 43 y.o. MRN: [id number]  Chief Complaint  Patient presents with   Follow-up    4 week follow up    Patient is here today for her 1 month follow up.  She has been feeling fairly well since last appointment.   She does have additional concerns to discuss today.   Debra Blake presents today with persistent cough and congestion, now in the third week of symptoms. Reports taking two bottles of Mucinex, a pack of Mucinex pills, and one bottle of Afrin, which have improved her overall feeling. Denies sore throat. Notes runny nose, cough, and congestion remain.   Additionally, Debra Blake reports intermittent mucus in stool with occasional reddish discoloration for the past few months. Expresses concern about possible blood. Describes stool consistency as almost diarrheal but predominantly mucinous. Denies rapid bowel movements after eating.   Also concerned about persistent under-eye bags despite adequate sleep, typically 8 hours per night (in bed by 8 PM, up by 5 AM). Labs are not due today.  She needs refills.   I have reviewed her active problem list, medication list, allergies, notes from last encounter, lab results for her appointment today.      No other concerns at this time.   Past Medical History:  Diagnosis Date   Anxiety    Autoimmune thyroiditis     Past Surgical History:  Procedure Laterality Date   TUBAL LIGATION  2015    Social History   Socioeconomic History   Marital status: Single    Spouse name: Not on file   Number of children: 3   Years of education: Not on file   Highest education level: Not on file  Occupational History   Not on file  Tobacco Use   Smoking status: Every Day    Current packs/day: 1.00    Types: Cigarettes   Smokeless tobacco: Never  Substance and Sexual Activity   Alcohol use: No   Drug use: No   Sexual activity: Yes     Birth control/protection: Surgical  Other Topics Concern   Not on file  Social History Narrative   Not on file   Social Drivers of Health   Financial Resource Strain: Not on file  Food Insecurity: Not on file  Transportation Needs: Not on file  Physical Activity: Not on file  Stress: Not on file  Social Connections: Not on file  Intimate Partner Violence: Not on file    No family history on file.  No Known Allergies  Review of Systems  All other systems reviewed and are negative.      Objective:   BP 120/84   Pulse 86   Ht 5' 5 (1.651 m)   Wt 179 lb 3.2 oz (81.3 kg)   SpO2 99%   BMI 29.82 kg/m   Vitals:   11/21/23 1451  BP: 120/84  Pulse: 86  Height: 5' 5 (1.651 m)  Weight: 179 lb 3.2 oz (81.3 kg)  SpO2: 99%  BMI (Calculated): 29.82    Physical Exam Vitals and nursing note reviewed.  Constitutional:      Appearance: Normal appearance. She is normal weight.  HENT:     Head: Normocephalic.  Eyes:     Extraocular Movements: Extraocular movements intact.     Conjunctiva/sclera: Conjunctivae normal.     Pupils: Pupils are equal, round, and reactive to light.  Cardiovascular:  Rate and Rhythm: Normal rate.  Pulmonary:     Effort: Pulmonary effort is normal.  Neurological:     General: No focal deficit present.     Mental Status: She is alert and oriented to person, place, and time. Mental status is at baseline.  Psychiatric:        Mood and Affect: Mood normal.        Behavior: Behavior normal.        Thought Content: Thought content normal.      No results found for any visits on 11/21/23.  Recent Results (from the past 2160 hours)  CMP14+EGFR     Status: Abnormal   Collection Time: 09/26/23  3:51 PM  Result Value Ref Range   Glucose 69 (L) 70 - 99 mg/dL   BUN 12 6 - 24 mg/dL   Creatinine, Ser 9.13 0.57 - 1.00 mg/dL   eGFR 87 >40 fO/fpw/8.26   BUN/Creatinine Ratio 14 9 - 23   Sodium 142 134 - 144 mmol/L   Potassium 3.9 3.5 - 5.2  mmol/L   Chloride 104 96 - 106 mmol/L   CO2 24 20 - 29 mmol/L   Calcium 8.9 8.7 - 10.2 mg/dL   Total Protein 6.2 6.0 - 8.5 g/dL   Albumin 3.9 3.9 - 4.9 g/dL   Globulin, Total 2.3 1.5 - 4.5 g/dL   Bilirubin Total <9.7 0.0 - 1.2 mg/dL   Alkaline Phosphatase 49 44 - 121 IU/L    Comment: **Effective October 03, 2023 Alkaline Phosphatase**   reference interval will be changing to:              Age                Female          Female           0 -  5 days         47 - 127       47 - 127           6 - 10 days         29 - 242       29 - 242          11 - 20 days        109 - 357      109 - 357          21 - 30 days         94 - 494       94 - 494           1 -  2 months      149 - 539      149 - 539           3 -  6 months      131 - 452      131 - 452           7 - 11 months      117 - 401      117 - 401   12 months -  6 years       158 - 369      158 - 369           7 - 12 years       150 - 409      150 - 409  13 years       156 - 435       78 - 227               14 years       114 - 375       64 - 161               15 years        88 - 279       56 - 134               16 years        74 - 207       51 - 121               17 years        63 - 161       47 - 113          18 - 20 years        51 - 125       42 - 106          21 - 50 years         47 - 123       41 - 116          51 - 80 years        49 - 135       51 - 125              >80 years        48 - 129       48 - 129    AST 18 0 - 40 IU/L   ALT 12 0 - 32 IU/L  Lipid panel     Status: Abnormal   Collection Time: 09/26/23  3:51 PM  Result Value Ref Range   Cholesterol, Total 206 (H) 100 - 199 mg/dL   Triglycerides 842 (H) 0 - 149 mg/dL   HDL 36 (L) >60 mg/dL   VLDL Cholesterol Cal 28 5 - 40 mg/dL   LDL Chol Calc (NIH) 857 (H) 0 - 99 mg/dL   Chol/HDL Ratio 5.7 (H) 0.0 - 4.4 ratio    Comment:                                   T. Chol/HDL Ratio                                             Men  Women                                1/2 Avg.Risk  3.4    3.3                                   Avg.Risk  5.0    4.4                                2X Avg.Risk  9.6    7.1  3X Avg.Risk 23.4   11.0   VITAMIN D  25 Hydroxy (Vit-D Deficiency, Fractures)     Status: None   Collection Time: 09/26/23  3:51 PM  Result Value Ref Range   Vit D, 25-Hydroxy 31.1 30.0 - 100.0 ng/mL    Comment: Vitamin D  deficiency has been defined by the Institute of Medicine and an Endocrine Society practice guideline as a level of serum 25-OH vitamin D  less than 20 ng/mL (1,2). The Endocrine Society went on to further define vitamin D  insufficiency as a level between 21 and 29 ng/mL (2). 1. IOM (Institute of Medicine). 2010. Dietary reference    intakes for calcium and D. [place]: The    [employer]. 2. Holick MF, Binkley , Bischoff-Ferrari HA, et al.    Evaluation, treatment, and prevention of vitamin D     deficiency: an Endocrine Society clinical practice    guideline. JCEM. 2011 Jul; 96(7):1911-30.   Vitamin B12     Status: None   Collection Time: 09/26/23  3:51 PM  Result Value Ref Range   Vitamin B-12 362 232 - 1,245 pg/mL  CBC with Diff     Status: Abnormal   Collection Time: 09/26/23  3:51 PM  Result Value Ref Range   WBC 7.4 3.4 - 10.8 x10E3/uL   RBC 4.64 3.77 - 5.28 x10E6/uL   Hemoglobin 14.0 11.1 - 15.9 g/dL   Hematocrit 56.1 65.9 - 46.6 %   MCV 94 79 - 97 fL   MCH 30.2 26.6 - 33.0 pg   MCHC 32.0 31.5 - 35.7 g/dL   RDW 84.0 (H) 88.2 - 84.5 %   Platelets 252 150 - 450 x10E3/uL   Neutrophils 54 Not Estab. %   Lymphs 33 Not Estab. %   Monocytes 7 Not Estab. %   Eos 5 Not Estab. %   Basos 1 Not Estab. %   Neutrophils Absolute 4.0 1.4 - 7.0 x10E3/uL   Lymphocytes Absolute 2.5 0.7 - 3.1 x10E3/uL   Monocytes Absolute 0.5 0.1 - 0.9 x10E3/uL   EOS (ABSOLUTE) 0.3 0.0 - 0.4 x10E3/uL   Basophils Absolute 0.0 0.0 - 0.2 x10E3/uL   Immature Granulocytes 0 Not Estab. %    Immature Grans (Abs) 0.0 0.0 - 0.1 x10E3/uL  Hemoglobin A1c     Status: None   Collection Time: 09/26/23  3:51 PM  Result Value Ref Range   Hgb A1c MFr Bld 5.0 4.8 - 5.6 %    Comment:          Prediabetes: 5.7 - 6.4          Diabetes: >6.4          Glycemic control for adults with diabetes: <7.0    Est. average glucose Bld gHb Est-mCnc 97 mg/dL  UDY+[id number]     Status: Abnormal   Collection Time: 09/26/23  3:51 PM  Result Value Ref Range   TSH 4.670 (H) 0.450 - 4.500 uIU/mL   T3, Free 2.5 2.0 - 4.4 pg/mL   Free T4 1.48 0.82 - 1.77 ng/dL       Assessment & Plan Diarrhea, unspecified type Ordering Fecal elastase to check for EPI.  Patient given collection supplies from lab.   Will contact with results when available.   Other fatigue Checking iron labs today.  The rest of her labs have been checked recently.  Will call with results when available.   Excessive daytime sleepiness Sending order for sleep study.  Will await results of testing so we can  determine next steps.   Acute upper respiratory infection Sending antibiotics.  Pt will let me know if this does not continue to improve.      Return in about 1 month (around 12/21/2023).   Total time spent: 20 minutes  ALAN CHRISTELLA ARRANT, FNP  11/21/2023   This document may have been prepared by [facility] Voice Recognition software and as such may include unintentional dictation errors.

## 2023-11-22 LAB — IRON,TIBC AND FERRITIN PANEL
Ferritin: 53 ng/mL (ref 15–150)
Iron Saturation: 18 % (ref 15–55)
Iron: 46 ug/dL (ref 27–159)
Total Iron Binding Capacity: 249 ug/dL — ABNORMAL LOW (ref 250–450)
UIBC: 203 ug/dL (ref 131–425)

## 2023-11-24 ENCOUNTER — Ambulatory Visit

## 2023-11-24 DIAGNOSIS — E782 Mixed hyperlipidemia: Secondary | ICD-10-CM

## 2023-11-24 DIAGNOSIS — E66812 Obesity, class 2: Secondary | ICD-10-CM

## 2023-11-24 DIAGNOSIS — F17200 Nicotine dependence, unspecified, uncomplicated: Secondary | ICD-10-CM

## 2023-11-24 DIAGNOSIS — R0602 Shortness of breath: Secondary | ICD-10-CM

## 2023-11-24 DIAGNOSIS — R0789 Other chest pain: Secondary | ICD-10-CM | POA: Diagnosis not present

## 2023-11-24 DIAGNOSIS — R7303 Prediabetes: Secondary | ICD-10-CM

## 2023-11-26 LAB — PANCREATIC ELASTASE, FECAL: Pancreatic Elastase, Fecal: >800 ug Elast./g (ref >200)

## 2023-11-29 ENCOUNTER — Ambulatory Visit: Payer: Self-pay

## 2023-12-05 ENCOUNTER — Ambulatory Visit: Admitting: Cardiovascular Disease

## 2023-12-05 ENCOUNTER — Encounter: Payer: Self-pay | Admitting: Cardiovascular Disease

## 2023-12-05 VITALS — BP 104/64 | HR 83 | Ht 65.0 in | Wt 180.2 lb

## 2023-12-05 DIAGNOSIS — E782 Mixed hyperlipidemia: Secondary | ICD-10-CM

## 2023-12-05 DIAGNOSIS — Z0130 Encounter for examination of blood pressure without abnormal findings: Secondary | ICD-10-CM

## 2023-12-05 DIAGNOSIS — R0602 Shortness of breath: Secondary | ICD-10-CM | POA: Diagnosis not present

## 2023-12-05 DIAGNOSIS — E66812 Obesity, class 2: Secondary | ICD-10-CM | POA: Diagnosis not present

## 2023-12-05 DIAGNOSIS — R0789 Other chest pain: Secondary | ICD-10-CM

## 2023-12-05 DIAGNOSIS — F17200 Nicotine dependence, unspecified, uncomplicated: Secondary | ICD-10-CM | POA: Diagnosis not present

## 2023-12-05 DIAGNOSIS — J069 Acute upper respiratory infection, unspecified: Secondary | ICD-10-CM

## 2023-12-05 DIAGNOSIS — Z6837 Body mass index (BMI) 37.0-37.9, adult: Secondary | ICD-10-CM

## 2023-12-05 DIAGNOSIS — Z131 Encounter for screening for diabetes mellitus: Secondary | ICD-10-CM

## 2023-12-05 MED ORDER — FUROSEMIDE 20 MG PO TABS: 20.0000 mg | ORAL_TABLET | Freq: Every day | ORAL | 11 refills | Status: AC

## 2023-12-05 MED ORDER — EMPAGLIFLOZIN 25 MG PO TABS: 25.0000 mg | ORAL_TABLET | Freq: Every day | ORAL | 3 refills | Status: DC

## 2023-12-05 NOTE — Progress Notes (Signed)
 Cardiology Office Note   Date:  12/05/2023   ID:  Debra Blake, DOB [birth year], MRN [id number]  PCP:  Orlean Alan HERO, FNP  Cardiologist:  Denyse Bathe, MD      History of Present Illness: Debra Blake is a 43 y.o. female who presents for  Chief Complaint  Patient presents with   Follow-up    Stress test and echo results    A month ago started with cough runny nose and keeps having recurrent congestion. Mucinex did not help. No fever.      Past Medical History:  Diagnosis Date   Anxiety    Autoimmune thyroiditis      Past Surgical History:  Procedure Laterality Date   TUBAL LIGATION  2015     Current Outpatient Medications  Medication Sig Dispense Refill   Adapalene 0.3 % gel Apply 2-3 times weekly at night to dry skin after cleaning. Increase frequency up to nightly as tolerated. 20 g 5   empagliflozin (JARDIANCE) 25 MG TABS tablet Take 1 tablet (25 mg total) by mouth daily. 90 tablet 3   furosemide (LASIX) 20 MG tablet Take 1 tablet (20 mg total) by mouth daily. 30 tablet 11   ketoconazole (NIZORAL) 2 % cream Apply to affected area of the nose twice daily 30 g 5   levothyroxine  (SYNTHROID ) 137 MCG tablet TAKE 1 TABLET BY MOUTH ONCE DAILY BEFORE BREAKFAST 90 tablet 0   pantoprazole (PROTONIX) 40 MG tablet Take 1 tablet (40 mg total) by mouth daily. 30 tablet 1   rosuvastatin (CRESTOR) 20 MG tablet Take 1 tablet (20 mg total) by mouth daily. 30 tablet 11   semaglutide -weight management (WEGOVY ) 1.7 MG/0.75ML SOAJ SQ injection Inject 1.7 mg into the skin once a week. 9 mL 0   VENTOLIN  HFA 108 (90 Base) MCG/ACT inhaler Inhale 1-2 puffs into the lungs every 4 (four) hours as needed for wheezing or shortness of breath. 18 g 3   No current facility-administered medications for this visit.    Allergies:   Patient has no known allergies.    Social History:   reports that she has been smoking cigarettes. She has never used smokeless tobacco. She  reports that she does not drink alcohol and does not use drugs.   Family History:  family history is not on file.    ROS:     Review of Systems  Constitutional: Negative.   HENT: Negative.    Eyes: Negative.   Respiratory: Negative.    Gastrointestinal: Negative.   Genitourinary: Negative.   Musculoskeletal: Negative.   Skin: Negative.   Neurological: Negative.   Endo/Heme/Allergies: Negative.   Psychiatric/Behavioral: Negative.    All other systems reviewed and are negative.     All other systems are reviewed and negative.    PHYSICAL EXAM: VS:  BP 104/64   Pulse 83   Ht 5' 5 (1.651 m)   Wt 180 lb 3.2 oz (81.7 kg)   SpO2 92%   BMI 29.99 kg/m  , BMI Body mass index is 29.99 kg/m. Last weight:  Wt Readings from Last 3 Encounters:  12/05/23 180 lb 3.2 oz (81.7 kg)  11/21/23 179 lb 3.2 oz (81.3 kg)  10/24/23 176 lb 12.8 oz (80.2 kg)     Physical Exam Constitutional:      Appearance: Normal appearance.  Cardiovascular:     Rate and Rhythm: Normal rate and regular rhythm.     Heart sounds: Normal heart sounds.  Pulmonary:  Effort: Pulmonary effort is normal.     Breath sounds: Normal breath sounds.  Musculoskeletal:     Right lower leg: No edema.     Left lower leg: No edema.  Neurological:     Mental Status: She is alert.       EKG:   Recent Labs: 09/26/2023: ALT 12; BUN 12; Creatinine, Ser 0.86; Hemoglobin 14.0; Platelets 252; Potassium 3.9; Sodium 142; TSH 4.670    Lipid Panel    Component Value Date/Time   CHOL 206 (H) 09/26/2023 1551   TRIG 157 (H) 09/26/2023 1551   HDL 36 (L) 09/26/2023 1551   CHOLHDL 5.7 (H) 09/26/2023 1551   LDLCALC 142 (H) 09/26/2023 1551      Other studies Reviewed: Additional studies/ records that were reviewed today include:  Review of the above records demonstrates:       No data to display            ASSESSMENT AND PLAN:    ICD-10-CM   1. Class 2 severe obesity due to excess calories with serious  comorbidity and body mass index (BMI) of 37.0 to 37.9 in adult  E66.812 furosemide (LASIX) 20 MG tablet   Z68.37 empagliflozin (JARDIANCE) 25 MG TABS tablet    2. Smoker  F17.200 furosemide (LASIX) 20 MG tablet    empagliflozin (JARDIANCE) 25 MG TABS tablet    3. SOB (shortness of breath)  R06.02 furosemide (LASIX) 20 MG tablet    empagliflozin (JARDIANCE) 25 MG TABS tablet   Still SOB, cough, LVEF on ECHO 52%, trace MR, advise lasix, aand jaurdiance as may diastolic dysfunction.    4. Other chest pain  R07.89 furosemide (LASIX) 20 MG tablet    empagliflozin (JARDIANCE) 25 MG TABS tablet   Stress test showed no ischaemia, thus chest pain non cardiac.    5. Mixed hyperlipidemia  E78.2 furosemide (LASIX) 20 MG tablet    empagliflozin (JARDIANCE) 25 MG TABS tablet   continue crestor 20 as LDL 142    6. Acute upper respiratory infection  J06.9 furosemide (LASIX) 20 MG tablet    empagliflozin (JARDIANCE) 25 MG TABS tablet       Problem List Items Addressed This Visit       Other   Mixed hyperlipidemia   Relevant Medications   furosemide (LASIX) 20 MG tablet   empagliflozin (JARDIANCE) 25 MG TABS tablet   Class 2 severe obesity due to excess calories with serious comorbidity and body mass index (BMI) of 37.0 to 37.9 in adult - Primary   Relevant Medications   furosemide (LASIX) 20 MG tablet   empagliflozin (JARDIANCE) 25 MG TABS tablet   Other Visit Diagnoses       Smoker       Relevant Medications   furosemide (LASIX) 20 MG tablet   empagliflozin (JARDIANCE) 25 MG TABS tablet     SOB (shortness of breath)       Still SOB, cough, LVEF on ECHO 52%, trace MR, advise lasix, aand jaurdiance as may diastolic dysfunction.   Relevant Medications   furosemide (LASIX) 20 MG tablet   empagliflozin (JARDIANCE) 25 MG TABS tablet     Other chest pain       Stress test showed no ischaemia, thus chest pain non cardiac.   Relevant Medications   furosemide (LASIX) 20 MG tablet    empagliflozin (JARDIANCE) 25 MG TABS tablet     Acute upper respiratory infection       Relevant Medications   furosemide (  LASIX) 20 MG tablet   empagliflozin (JARDIANCE) 25 MG TABS tablet          Disposition:   Return in about 4 weeks (around 01/02/2024).    Total time spent: 30 minutes  Signed,  Denyse Bathe, MD  12/05/2023 2:19 PM    [facility]

## 2023-12-15 ENCOUNTER — Other Ambulatory Visit: Payer: Self-pay | Admitting: Family

## 2023-12-17 ENCOUNTER — Other Ambulatory Visit: Payer: Self-pay | Admitting: Cardiovascular Disease

## 2023-12-17 DIAGNOSIS — E66812 Obesity, class 2: Secondary | ICD-10-CM

## 2023-12-17 DIAGNOSIS — F17200 Nicotine dependence, unspecified, uncomplicated: Secondary | ICD-10-CM

## 2023-12-17 DIAGNOSIS — R7303 Prediabetes: Secondary | ICD-10-CM

## 2023-12-17 DIAGNOSIS — E782 Mixed hyperlipidemia: Secondary | ICD-10-CM

## 2023-12-17 DIAGNOSIS — R0789 Other chest pain: Secondary | ICD-10-CM

## 2023-12-17 DIAGNOSIS — R0602 Shortness of breath: Secondary | ICD-10-CM

## 2023-12-21 ENCOUNTER — Other Ambulatory Visit: Payer: Self-pay

## 2023-12-26 ENCOUNTER — Encounter: Payer: Self-pay | Admitting: Family

## 2023-12-26 ENCOUNTER — Ambulatory Visit: Admitting: Family

## 2024-01-02 ENCOUNTER — Ambulatory Visit: Admitting: Cardiovascular Disease

## 2024-01-02 ENCOUNTER — Encounter: Payer: Self-pay | Admitting: Cardiovascular Disease

## 2024-01-02 VITALS — BP 118/78 | HR 93 | Ht 65.0 in | Wt 180.0 lb

## 2024-01-02 DIAGNOSIS — E66812 Obesity, class 2: Secondary | ICD-10-CM

## 2024-01-02 DIAGNOSIS — R0789 Other chest pain: Secondary | ICD-10-CM

## 2024-01-02 DIAGNOSIS — R079 Chest pain, unspecified: Secondary | ICD-10-CM

## 2024-01-02 DIAGNOSIS — R0602 Shortness of breath: Secondary | ICD-10-CM

## 2024-01-02 DIAGNOSIS — F17200 Nicotine dependence, unspecified, uncomplicated: Secondary | ICD-10-CM

## 2024-01-02 DIAGNOSIS — E782 Mixed hyperlipidemia: Secondary | ICD-10-CM

## 2024-01-02 DIAGNOSIS — J069 Acute upper respiratory infection, unspecified: Secondary | ICD-10-CM

## 2024-01-02 MED ORDER — SPIRONOLACTONE 25 MG PO TABS: 12.5000 mg | ORAL_TABLET | Freq: Every day | ORAL | 11 refills | Status: DC

## 2024-01-02 MED ORDER — EMPAGLIFLOZIN 25 MG PO TABS: 25.0000 mg | ORAL_TABLET | Freq: Every day | ORAL | 3 refills | Status: DC

## 2024-01-02 NOTE — Progress Notes (Signed)
 Cardiology Office Note   Date:  01/02/2024   ID:  Debra Blake, DOB 1980-03-20, MRN [id number]  PCP:  Orlean Alan HERO, FNP  Cardiologist:  Denyse Bathe, MD      History of Present Illness: Debra Blake is a 43 y.o. female who presents for  Chief Complaint  Patient presents with   Follow-up    1 month follow up     Doing better.      Past Medical History:  Diagnosis Date   Anxiety    Autoimmune thyroiditis      Past Surgical History:  Procedure Laterality Date   TUBAL LIGATION  2015     Current Outpatient Medications  Medication Sig Dispense Refill   Adapalene  0.3 % gel Apply 2-3 times weekly at night to dry skin after cleaning. Increase frequency up to nightly as tolerated. 20 g 5   furosemide  (LASIX ) 20 MG tablet Take 1 tablet (20 mg total) by mouth daily. 30 tablet 11   ketoconazole  (NIZORAL ) 2 % cream Apply to affected area of the nose twice daily 30 g 5   levothyroxine  (SYNTHROID ) 137 MCG tablet TAKE 1 TABLET BY MOUTH ONCE DAILY BEFORE BREAKFAST 90 tablet 0   pantoprazole  (PROTONIX ) 40 MG tablet Take 1 tablet by mouth once daily 60 tablet 0   rosuvastatin  (CRESTOR ) 20 MG tablet Take 1 tablet (20 mg total) by mouth daily. 30 tablet 11   semaglutide -weight management (WEGOVY ) 1.7 MG/0.75ML SOAJ SQ injection Inject 1.7 mg into the skin once a week. 9 mL 0   spironolactone  (ALDACTONE ) 25 MG tablet Take 0.5 tablets (12.5 mg total) by mouth daily. 30 tablet 11   VENTOLIN  HFA 108 (90 Base) MCG/ACT inhaler Inhale 1-2 puffs into the lungs every 4 (four) hours as needed for wheezing or shortness of breath. 18 g 3   No current facility-administered medications for this visit.    Allergies:   Patient has no known allergies.    Social History:   reports that she has been smoking cigarettes. She has never used smokeless tobacco. She reports that she does not drink alcohol and does not use drugs.   Family History:  family history is not on file.     ROS:     Review of Systems  Constitutional: Negative.   HENT: Negative.    Eyes: Negative.   Respiratory: Negative.    Gastrointestinal: Negative.   Genitourinary: Negative.   Musculoskeletal: Negative.   Skin: Negative.   Neurological: Negative.   Endo/Heme/Allergies: Negative.   Psychiatric/Behavioral: Negative.    All other systems reviewed and are negative.     All other systems are reviewed and negative.    PHYSICAL EXAM: VS:  BP 118/78   Pulse 93   Ht 5' 5 (1.651 m)   Wt 180 lb (81.6 kg)   SpO2 97%   BMI 29.95 kg/m  , BMI Body mass index is 29.95 kg/m. Last weight:  Wt Readings from Last 3 Encounters:  01/02/24 180 lb (81.6 kg)  12/26/23 177 lb 6.4 oz (80.5 kg)  12/05/23 180 lb 3.2 oz (81.7 kg)     Physical Exam Constitutional:      Appearance: Normal appearance.  Cardiovascular:     Rate and Rhythm: Normal rate and regular rhythm.     Heart sounds: Normal heart sounds.  Pulmonary:     Effort: Pulmonary effort is normal.     Breath sounds: Normal breath sounds.  Musculoskeletal:     Right  lower leg: No edema.     Left lower leg: No edema.  Neurological:     Mental Status: She is alert.       EKG:   Recent Labs: 09/26/2023: ALT 12; BUN 12; Creatinine, Ser 0.86; Hemoglobin 14.0; Platelets 252; Potassium 3.9; Sodium 142; TSH 4.670    Lipid Panel    Component Value Date/Time   CHOL 206 (H) 09/26/2023 1551   TRIG 157 (H) 09/26/2023 1551   HDL 36 (L) 09/26/2023 1551   CHOLHDL 5.7 (H) 09/26/2023 1551   LDLCALC 142 (H) 09/26/2023 1551      Other studies Reviewed: Additional studies/ records that were reviewed today include:  Review of the above records demonstrates:       No data to display            ASSESSMENT AND PLAN:    ICD-10-CM   1. Mixed hyperlipidemia  E78.2 spironolactone  (ALDACTONE ) 25 MG tablet    DISCONTINUED: empagliflozin  (JARDIANCE ) 25 MG TABS tablet    2. Class 2 severe obesity due to excess calories with  serious comorbidity and body mass index (BMI) of 37.0 to 37.9 in adult  E66.812 spironolactone  (ALDACTONE ) 25 MG tablet   Z68.37 DISCONTINUED: empagliflozin  (JARDIANCE ) 25 MG TABS tablet    3. Other chest pain  R07.89 spironolactone  (ALDACTONE ) 25 MG tablet    DISCONTINUED: empagliflozin  (JARDIANCE ) 25 MG TABS tablet    4. SOB (shortness of breath)  R06.02 spironolactone  (ALDACTONE ) 25 MG tablet    DISCONTINUED: empagliflozin  (JARDIANCE ) 25 MG TABS tablet   Jaurdiance was  denied, will try aldactone  12.5 and lasix .    5. Smoker  F17.200 spironolactone  (ALDACTONE ) 25 MG tablet    DISCONTINUED: empagliflozin  (JARDIANCE ) 25 MG TABS tablet    6. Chest pain, unspecified type  R07.9 spironolactone  (ALDACTONE ) 25 MG tablet    7. SOB (shortness of breath)  R06.02 spironolactone  (ALDACTONE ) 25 MG tablet    DISCONTINUED: empagliflozin  (JARDIANCE ) 25 MG TABS tablet   Still SOB, cough, LVEF on ECHO 52%, trace MR, advise lasix , aand jaurdiance as may diastolic dysfunction.    8. Other chest pain  R07.89 spironolactone  (ALDACTONE ) 25 MG tablet    DISCONTINUED: empagliflozin  (JARDIANCE ) 25 MG TABS tablet   Stress test showed no ischaemia, thus chest pain non cardiac.    9. Mixed hyperlipidemia  E78.2 spironolactone  (ALDACTONE ) 25 MG tablet    DISCONTINUED: empagliflozin  (JARDIANCE ) 25 MG TABS tablet   continue crestor  20 as LDL 142    10. Acute upper respiratory infection  J06.9 spironolactone  (ALDACTONE ) 25 MG tablet    DISCONTINUED: empagliflozin  (JARDIANCE ) 25 MG TABS tablet       Problem List Items Addressed This Visit       Other   Mixed hyperlipidemia - Primary   Relevant Medications   spironolactone  (ALDACTONE ) 25 MG tablet   Class 2 severe obesity due to excess calories with serious comorbidity and body mass index (BMI) of 37.0 to 37.9 in adult   Relevant Medications   spironolactone  (ALDACTONE ) 25 MG tablet   Other Visit Diagnoses       Other chest pain       Relevant  Medications   spironolactone  (ALDACTONE ) 25 MG tablet     SOB (shortness of breath)       Jaurdiance was  denied, will try aldactone  12.5 and lasix .   Relevant Medications   spironolactone  (ALDACTONE ) 25 MG tablet     Smoker       Relevant Medications  spironolactone  (ALDACTONE ) 25 MG tablet     Chest pain, unspecified type       Relevant Medications   spironolactone  (ALDACTONE ) 25 MG tablet     SOB (shortness of breath)       Still SOB, cough, LVEF on ECHO 52%, trace MR, advise lasix , aand jaurdiance as may diastolic dysfunction.   Relevant Medications   spironolactone  (ALDACTONE ) 25 MG tablet     Other chest pain       Stress test showed no ischaemia, thus chest pain non cardiac.   Relevant Medications   spironolactone  (ALDACTONE ) 25 MG tablet     Acute upper respiratory infection       Relevant Medications   spironolactone  (ALDACTONE ) 25 MG tablet          Disposition:   Return in about 4 weeks (around 01/30/2024).    Total time spent: 30 minutes  Signed,  Denyse Bathe, MD  01/02/2024 2:24 PM    [facility]

## 2024-01-07 ENCOUNTER — Other Ambulatory Visit: Payer: Self-pay

## 2024-01-07 ENCOUNTER — Emergency Department
Admission: EM | Admit: 2024-01-07 | Discharge: 2024-01-07 | Disposition: A | Discharge status: Home / Self Care | Attending: Emergency Medicine | Admitting: Emergency Medicine

## 2024-01-07 ENCOUNTER — Encounter: Payer: Self-pay | Admitting: Emergency Medicine

## 2024-01-07 DIAGNOSIS — K529 Noninfective gastroenteritis and colitis, unspecified: Secondary | ICD-10-CM | POA: Insufficient documentation

## 2024-01-07 DIAGNOSIS — R112 Nausea with vomiting, unspecified: Secondary | ICD-10-CM | POA: Diagnosis present

## 2024-01-07 LAB — CBC WITH DIFFERENTIAL/PLATELET
Abs Immature Granulocytes: 0.08 K/uL — ABNORMAL HIGH (ref 0.00–0.07)
Basophils Absolute: 0.1 K/uL (ref 0.0–0.1)
Basophils Relative: 0 %
Eosinophils Absolute: 0.2 K/uL (ref 0.0–0.5)
Eosinophils Relative: 1 %
HCT: 42.2 % (ref 36.0–46.0)
Hemoglobin: 14.2 g/dL (ref 12.0–15.0)
Immature Granulocytes: 0 %
Lymphocytes Relative: 7 %
Lymphs Abs: 1.2 K/uL (ref 0.7–4.0)
MCH: 31.8 pg (ref 26.0–34.0)
MCHC: 33.6 g/dL (ref 30.0–36.0)
MCV: 94.4 fL (ref 80.0–100.0)
Monocytes Absolute: 1.2 K/uL — ABNORMAL HIGH (ref 0.1–1.0)
Monocytes Relative: 6 %
Neutro Abs: 16.1 K/uL — ABNORMAL HIGH (ref 1.7–7.7)
Neutrophils Relative %: 86 %
Platelets: 217 K/uL (ref 150–400)
RBC: 4.47 MIL/uL (ref 3.87–5.11)
RDW: 13.2 % (ref 11.5–15.5)
WBC: 18.8 K/uL — ABNORMAL HIGH (ref 4.0–10.5)
nRBC: 0 % (ref 0.0–0.2)

## 2024-01-07 LAB — COMPREHENSIVE METABOLIC PANEL WITH GFR
ALT: 10 U/L (ref 0–44)
AST: 20 U/L (ref 15–41)
Albumin: 3.4 g/dL — ABNORMAL LOW (ref 3.5–5.0)
Alkaline Phosphatase: 42 U/L (ref 38–126)
Anion gap: 12 (ref 5–15)
BUN: 14 mg/dL (ref 6–20)
CO2: 24 mmol/L (ref 22–32)
Calcium: 7.7 mg/dL — ABNORMAL LOW (ref 8.9–10.3)
Chloride: 106 mmol/L (ref 98–111)
Creatinine, Ser: 0.74 mg/dL (ref 0.44–1.00)
GFR, Estimated: >60 mL/min
Glucose, Bld: 96 mg/dL (ref 70–99)
Potassium: 3.5 mmol/L (ref 3.5–5.1)
Sodium: 142 mmol/L (ref 135–145)
Total Bilirubin: <0.2 mg/dL (ref 0.0–1.2)
Total Protein: 5.6 g/dL — ABNORMAL LOW (ref 6.5–8.1)

## 2024-01-07 LAB — LIPASE, BLOOD: Lipase: 36 U/L (ref 11–51)

## 2024-01-07 MED ORDER — ONDANSETRON 4 MG PO TBDP: 4.0000 mg | ORAL_TABLET | Freq: Three times a day (TID) | ORAL | 0 refills | Status: DC | PRN

## 2024-01-07 MED ORDER — SODIUM CHLORIDE 0.9 % IV BOLUS
500.0000 mL | Freq: Once | INTRAVENOUS | Status: AC
  Administered 2024-01-07: 500 mL via INTRAVENOUS

## 2024-01-07 MED ORDER — ONDANSETRON 4 MG PO TBDP
8.0000 mg | ORAL_TABLET | Freq: Once | ORAL | Status: AC
  Administered 2024-01-07: 8 mg via ORAL
  Filled 2024-01-07: qty 2

## 2024-01-07 NOTE — ED Triage Notes (Signed)
 Pt to ED via [facility] from home with c/o heperemesis that started at 1700. Per EMS pt denies being pregnant. Per EMS pt with no recent infection, was at work, at food provided by work, started feeling sick, and went home and began vomiting. Per EMS pt received 4mg  Zofran  IV.     190/156 20g L AC

## 2024-01-07 NOTE — ED Provider Notes (Signed)
 "  [facility] Provider Note    Event Date/Time   First MD Initiated Contact with Patient 01/07/24 2019     (approximate)   History   Chief Complaint: Vomiting  HPI  Carlethia ALARIA OCONNOR is a 43 y.o. female with a history of anxiety who reports developing nausea vomiting and generalized abdominal pain this afternoon.  While dry heaving she also felt clammy, sweaty.  After receiving 4 mg IV Zofran  by EMS, she feels much better and asks for water.  Denies chest pain, shortness of breath or fever.  No diarrhea, no hematemesis.  She notes that today for lunch there was a work event where her boss provided salad and baked ziti that they had prepared at home.        Past Medical History:  Diagnosis Date   Anxiety    Autoimmune thyroiditis     Current Outpatient Rx   Order #: [id number] Class: Normal   Order #: [id number] Class: Normal   Order #: [id number] Class: Normal   Order #: [id number] Class: Normal   Order #: [id number] Class: Normal   Order #: [id number] Class: Normal   Order #: [id number] Class: Normal   Order #: [id number] Class: Normal   Order #: [id number] Class: Normal   Order #: [id number] Class: Normal    Past Surgical History:  Procedure Laterality Date   TUBAL LIGATION  2015    Physical Exam   Triage Vital Signs: ED Triage Vitals  Encounter Vitals Group     BP 01/07/24 2024 (!) 123/57     Girls Systolic BP Percentile --      Girls Diastolic BP Percentile --      Boys Systolic BP Percentile --      Boys Diastolic BP Percentile --      Pulse Rate 01/07/24 2024 94     Resp 01/07/24 2024 16     Temp 01/07/24 2024 98.9 F (37.2 C)     Temp Source 01/07/24 2024 Oral     SpO2 01/07/24 2024 95 %     Weight 01/07/24 2022 175 lb (79.4 kg)     Height 01/07/24 2022 5' 5 (1.651 m)     Head Circumference --      Peak Flow --      Pain Score 01/07/24 2021 0     Pain Loc --      Pain Education --      Exclude from Growth Chart --     Most recent  vital signs: Vitals:   01/07/24 2128 01/07/24 2233  BP:  99/60  Pulse:  71  Resp:  19  Temp:  98.7 F (37.1 C)  SpO2: 96% 96%    General: Awake, no distress.  CV:  Good peripheral perfusion.  Regular rate rhythm Resp:  Normal effort.  Clear lungs Abd:  No distention.  Soft, mild generalized tenderness, nonfocal. Other:  Moist oral mucosa.  Nontoxic   ED Results / Procedures / Treatments   Labs (all labs ordered are listed, but only abnormal results are displayed) Labs Reviewed  COMPREHENSIVE METABOLIC PANEL WITH GFR - Abnormal; Notable for the following components:      Result Value   Calcium  7.7 (*)    Total Protein 5.6 (*)    Albumin 3.4 (*)    All other components within normal limits  CBC WITH DIFFERENTIAL/PLATELET - Abnormal; Notable for the following components:   WBC 18.8 (*)    Neutro Abs 16.1 (*)    Monocytes Absolute 1.2 (*)  Abs Immature Granulocytes 0.08 (*)    All other components within normal limits  LIPASE, BLOOD     EKG Interpreted by me Sinus rhythm rate of 93.  Normal axis and intervals.  Poor R wave progression.  No acute ischemic changes.   RADIOLOGY    PROCEDURES:  Procedures   MEDICATIONS ORDERED IN ED: Medications  sodium chloride  0.9 % bolus 500 mL (0 mLs Intravenous Stopped 01/07/24 2216)  ondansetron  (ZOFRAN -ODT) disintegrating tablet 8 mg (8 mg Oral Given 01/07/24 2231)     IMPRESSION / MDM / ASSESSMENT AND PLAN / ED COURSE  I reviewed the triage vital signs and the nursing notes.  DDx: Viral gastritis, foodborne illness, pancreatitis, dehydration, electrolyte derangement  Patient's presentation is most consistent with acute presentation with potential threat to life or bodily function.  Patient presents with nausea vomiting which is now resolved after Zofran , feeling much better.  Exam is nonfocal, overall reassuring, labs and vital signs unremarkable except for leukocytosis, nonspecific.  Doubt sepsis, appendicitis,  cholecystitis, bowel obstruction.  Stable for discharge       FINAL CLINICAL IMPRESSION(S) / ED DIAGNOSES   Final diagnoses:  Gastroenteritis     Rx / DC Orders   ED Discharge Orders          Ordered    ondansetron  (ZOFRAN -ODT) 4 MG disintegrating tablet  Every 8 hours PRN        01/07/24 2213             Note:  This document was prepared using Dragon voice recognition software and may include unintentional dictation errors.   Viviann Pastor, MD 01/07/24 2306  "

## 2024-01-10 ENCOUNTER — Telehealth: Payer: Self-pay

## 2024-01-10 NOTE — Telephone Encounter (Signed)
 Pt states over the weekend she was vomiting profusely, and having back pain. Pt went to the hospital but they told her it was the stomach bug. Pt states her chest has been sore since and is concerned it was a heart attack/heart related. Pt wanted me to see what you thought. Please advise.

## 2024-01-16 ENCOUNTER — Ambulatory Visit: Admitting: Cardiovascular Disease

## 2024-02-06 ENCOUNTER — Encounter: Payer: Self-pay | Admitting: Cardiovascular Disease

## 2024-02-06 ENCOUNTER — Ambulatory Visit: Admitting: Cardiovascular Disease

## 2024-02-06 VITALS — BP 106/66 | HR 86 | Ht 65.0 in | Wt 177.8 lb

## 2024-02-06 DIAGNOSIS — E66812 Obesity, class 2: Secondary | ICD-10-CM | POA: Diagnosis not present

## 2024-02-06 DIAGNOSIS — R0602 Shortness of breath: Secondary | ICD-10-CM

## 2024-02-06 DIAGNOSIS — E782 Mixed hyperlipidemia: Secondary | ICD-10-CM

## 2024-02-06 DIAGNOSIS — R079 Chest pain, unspecified: Secondary | ICD-10-CM | POA: Diagnosis not present

## 2024-02-06 DIAGNOSIS — F17200 Nicotine dependence, unspecified, uncomplicated: Secondary | ICD-10-CM

## 2024-02-06 DIAGNOSIS — Z6837 Body mass index (BMI) 37.0-37.9, adult: Secondary | ICD-10-CM | POA: Diagnosis not present

## 2024-02-06 DIAGNOSIS — Z0130 Encounter for examination of blood pressure without abnormal findings: Secondary | ICD-10-CM

## 2024-02-06 NOTE — Progress Notes (Signed)
 "     Cardiology Office Note   Date:  02/06/2024   ID:  Debra Blake, DOB [birth year], MRN [id number]  PCP:  Orlean Alan HERO, FNP  Cardiologist:  Denyse Bathe, MD      History of Present Illness: Debra Blake is a 44 y.o. female who presents for  Chief Complaint  Patient presents with   Follow-up    1 month follow up    01/07/24, HAD CALLED EMS, AS WAS NAUSIA, AND VOMITING. BUT HAVE THAT HAD CHEST PAIN.      Past Medical History:  Diagnosis Date   Anxiety    Autoimmune thyroiditis      Past Surgical History:  Procedure Laterality Date   TUBAL LIGATION  2015     Current Outpatient Medications  Medication Sig Dispense Refill   furosemide  (LASIX ) 20 MG tablet Take 1 tablet (20 mg total) by mouth daily. 30 tablet 11   ketoconazole  (NIZORAL ) 2 % cream Apply to affected area of the nose twice daily 30 g 5   levothyroxine  (SYNTHROID ) 137 MCG tablet TAKE 1 TABLET BY MOUTH ONCE DAILY BEFORE BREAKFAST 90 tablet 0   pantoprazole  (PROTONIX ) 40 MG tablet Take 1 tablet by mouth once daily 60 tablet 0   rosuvastatin  (CRESTOR ) 20 MG tablet Take 1 tablet (20 mg total) by mouth daily. 30 tablet 11   semaglutide -weight management (WEGOVY ) 1.7 MG/0.75ML SOAJ SQ injection Inject 1.7 mg into the skin once a week. 9 mL 0   VENTOLIN  HFA 108 (90 Base) MCG/ACT inhaler Inhale 1-2 puffs into the lungs every 4 (four) hours as needed for wheezing or shortness of breath. 18 g 3   Adapalene  0.3 % gel Apply 2-3 times weekly at night to dry skin after cleaning. Increase frequency up to nightly as tolerated. 20 g 5   No current facility-administered medications for this visit.    Allergies:   Patient has no known allergies.    Social History:   reports that she has been smoking cigarettes. She has never used smokeless tobacco. She reports that she does not drink alcohol and does not use drugs.   Family History:  family history is not on file.    ROS:     Review of Systems   Constitutional: Negative.   HENT: Negative.    Eyes: Negative.   Respiratory: Negative.    Gastrointestinal: Negative.   Genitourinary: Negative.   Musculoskeletal: Negative.   Skin: Negative.   Neurological: Negative.   Endo/Heme/Allergies: Negative.   Psychiatric/Behavioral: Negative.    All other systems reviewed and are negative.     All other systems are reviewed and negative.    PHYSICAL EXAM: VS:  BP 106/66   Pulse 86   Ht 5' 5 (1.651 m)   Wt 177 lb 12.8 oz (80.6 kg)   LMP 12/31/2023 (Approximate)   SpO2 97%   BMI 29.59 kg/m  , BMI Body mass index is 29.59 kg/m. Last weight:  Wt Readings from Last 3 Encounters:  02/06/24 177 lb 12.8 oz (80.6 kg)  01/07/24 175 lb (79.4 kg)  01/02/24 180 lb (81.6 kg)     Physical Exam Constitutional:      Appearance: Normal appearance.  Cardiovascular:     Rate and Rhythm: Normal rate and regular rhythm.     Heart sounds: Normal heart sounds.  Pulmonary:     Effort: Pulmonary effort is normal.     Breath sounds: Normal breath sounds.  Musculoskeletal:  Right lower leg: No edema.     Left lower leg: No edema.  Neurological:     Mental Status: She is alert.       EKG:   Recent Labs: 09/26/2023: TSH 4.670 01/07/2024: ALT 10; BUN 14; Creatinine, Ser 0.74; Hemoglobin 14.2; Platelets 217; Potassium 3.5; Sodium 142    Lipid Panel    Component Value Date/Time   CHOL 206 (H) 09/26/2023 1551   TRIG 157 (H) 09/26/2023 1551   HDL 36 (L) 09/26/2023 1551   CHOLHDL 5.7 (H) 09/26/2023 1551   LDLCALC 142 (H) 09/26/2023 1551      Other studies Reviewed: Additional studies/ records that were reviewed today include:  Review of the above records demonstrates:       No data to display            ASSESSMENT AND PLAN:    ICD-10-CM   1. Chest pain, unspecified type  R07.9    STRESS TEST WAS NORMAL IN 11/25. HAD GERD PROBABLY, CHANGR  PROTONIX  BID FOR A WEEK, THEN ONCE A DAY.    2. Mixed hyperlipidemia  E78.2      3. Class 2 severe obesity due to excess calories with serious comorbidity and body mass index (BMI) of 37.0 to 37.9 in adult  E66.812    Z68.37     4. SOB (shortness of breath)  R06.02     5. Smoker  F17.200        Problem List Items Addressed This Visit       Other   Mixed hyperlipidemia   Class 2 severe obesity due to excess calories with serious comorbidity and body mass index (BMI) of 37.0 to 37.9 in adult   Other Visit Diagnoses       Chest pain, unspecified type    -  Primary   STRESS TEST WAS NORMAL IN 11/25. HAD GERD PROBABLY, CHANGR  PROTONIX  BID FOR A WEEK, THEN ONCE A DAY.     SOB (shortness of breath)         Smoker              Disposition:   Return in about 3 months (around 05/06/2024).    Total time spent: 35 minutes  Signed,  Denyse Bathe, MD  02/06/2024 3:28 PM    [facility] "

## 2024-02-08 ENCOUNTER — Telehealth: Payer: Self-pay | Admitting: Family

## 2024-02-08 ENCOUNTER — Telehealth: Payer: Self-pay

## 2024-02-08 NOTE — Telephone Encounter (Signed)
 Patient left message with answering service that she received a letter from her insurance, [employer], stating that they are covering Wegovy  again and she wants to know if she can get started back on it. Please advise.

## 2024-02-15 ENCOUNTER — Other Ambulatory Visit: Payer: Self-pay | Admitting: Cardiovascular Disease

## 2024-02-15 DIAGNOSIS — R0602 Shortness of breath: Secondary | ICD-10-CM

## 2024-02-15 DIAGNOSIS — Z6837 Body mass index (BMI) 37.0-37.9, adult: Secondary | ICD-10-CM

## 2024-02-15 DIAGNOSIS — R0789 Other chest pain: Secondary | ICD-10-CM

## 2024-02-15 DIAGNOSIS — F17200 Nicotine dependence, unspecified, uncomplicated: Secondary | ICD-10-CM

## 2024-02-15 DIAGNOSIS — R7303 Prediabetes: Secondary | ICD-10-CM

## 2024-02-15 DIAGNOSIS — E782 Mixed hyperlipidemia: Secondary | ICD-10-CM

## 2024-02-27 ENCOUNTER — Ambulatory Visit: Admitting: Family

## 2024-03-26 ENCOUNTER — Ambulatory Visit: Admitting: Family

## 2024-04-02 ENCOUNTER — Ambulatory Visit: Admitting: Family

## 2024-05-07 ENCOUNTER — Ambulatory Visit: Admitting: Cardiovascular Disease
